# Patient Record
Sex: Female | Born: 1980 | Race: Black or African American | Hispanic: No | Marital: Single | Smoking: Never smoker
Health system: Southern US, Community
[De-identification: ages and names within clinical notes are randomized; demographics above are authoritative.]

## PROBLEM LIST (undated history)

## (undated) DIAGNOSIS — B009 Herpesviral infection, unspecified: Secondary | ICD-10-CM

## (undated) DIAGNOSIS — J45909 Unspecified asthma, uncomplicated: Secondary | ICD-10-CM

## (undated) DIAGNOSIS — G43909 Migraine, unspecified, not intractable, without status migrainosus: Secondary | ICD-10-CM

## (undated) HISTORY — PX: TUBAL LIGATION: SHX77

---

## 2006-08-15 ENCOUNTER — Emergency Department (HOSPITAL_COMMUNITY): Admission: EM | Admit: 2006-08-15 | Discharge: 2006-08-15 | Payer: Self-pay | Admitting: Emergency Medicine

## 2007-04-21 ENCOUNTER — Emergency Department (HOSPITAL_COMMUNITY): Admission: EM | Admit: 2007-04-21 | Discharge: 2007-04-22 | Payer: Self-pay | Admitting: Emergency Medicine

## 2007-05-15 ENCOUNTER — Emergency Department (HOSPITAL_COMMUNITY): Admission: EM | Admit: 2007-05-15 | Discharge: 2007-05-16 | Payer: Self-pay | Admitting: Emergency Medicine

## 2007-08-17 ENCOUNTER — Emergency Department (HOSPITAL_COMMUNITY): Admission: EM | Admit: 2007-08-17 | Discharge: 2007-08-17 | Payer: Self-pay | Admitting: Emergency Medicine

## 2007-12-13 ENCOUNTER — Other Ambulatory Visit: Admission: RE | Admit: 2007-12-13 | Discharge: 2007-12-13 | Payer: Self-pay | Admitting: Family Medicine

## 2008-09-10 ENCOUNTER — Emergency Department (HOSPITAL_COMMUNITY): Admission: EM | Admit: 2008-09-10 | Discharge: 2008-09-11 | Payer: Self-pay | Admitting: Emergency Medicine

## 2009-11-25 ENCOUNTER — Emergency Department (HOSPITAL_COMMUNITY): Admission: EM | Admit: 2009-11-25 | Discharge: 2009-11-25 | Payer: Self-pay | Admitting: Emergency Medicine

## 2011-03-03 ENCOUNTER — Other Ambulatory Visit (HOSPITAL_COMMUNITY)
Admission: RE | Admit: 2011-03-03 | Discharge: 2011-03-03 | Disposition: A | Discharge status: Home / Self Care | Payer: PRIVATE HEALTH INSURANCE | Source: Ambulatory Visit | Attending: Obstetrics and Gynecology | Admitting: Obstetrics and Gynecology

## 2011-03-03 DIAGNOSIS — Z01419 Encounter for gynecological examination (general) (routine) without abnormal findings: Secondary | ICD-10-CM | POA: Insufficient documentation

## 2011-03-03 DIAGNOSIS — Z113 Encounter for screening for infections with a predominantly sexual mode of transmission: Secondary | ICD-10-CM | POA: Insufficient documentation

## 2011-09-02 LAB — COMPREHENSIVE METABOLIC PANEL
Albumin: 4.1
BUN: 6
Calcium: 9.5
Creatinine, Ser: 0.63
Total Protein: 7.8

## 2011-09-02 LAB — URINALYSIS, ROUTINE W REFLEX MICROSCOPIC
Glucose, UA: NEGATIVE
Hgb urine dipstick: NEGATIVE
Ketones, ur: NEGATIVE
Protein, ur: NEGATIVE
pH: 6.5

## 2011-09-02 LAB — DIFFERENTIAL
Basophils Relative: 0
Eosinophils Absolute: 0
Lymphs Abs: 1
Neutrophils Relative %: 87 — ABNORMAL HIGH

## 2011-09-02 LAB — CBC
MCHC: 35.2
RBC: 5.03
RDW: 13.1

## 2011-09-02 LAB — PREGNANCY, URINE: Preg Test, Ur: NEGATIVE

## 2012-03-01 DIAGNOSIS — N390 Urinary tract infection, site not specified: Secondary | ICD-10-CM | POA: Insufficient documentation

## 2012-03-01 DIAGNOSIS — IMO0001 Reserved for inherently not codable concepts without codable children: Secondary | ICD-10-CM | POA: Insufficient documentation

## 2012-03-01 DIAGNOSIS — D269 Other benign neoplasm of uterus, unspecified: Secondary | ICD-10-CM | POA: Insufficient documentation

## 2012-03-01 DIAGNOSIS — J45909 Unspecified asthma, uncomplicated: Secondary | ICD-10-CM | POA: Insufficient documentation

## 2012-03-01 DIAGNOSIS — G43909 Migraine, unspecified, not intractable, without status migrainosus: Secondary | ICD-10-CM | POA: Insufficient documentation

## 2012-03-01 DIAGNOSIS — Z975 Presence of (intrauterine) contraceptive device: Secondary | ICD-10-CM | POA: Insufficient documentation

## 2012-12-07 ENCOUNTER — Emergency Department (HOSPITAL_COMMUNITY)
Admission: EM | Admit: 2012-12-07 | Discharge: 2012-12-07 | Disposition: A | Discharge status: Home / Self Care | Payer: 59 | Source: Home / Self Care | Attending: Emergency Medicine | Admitting: Emergency Medicine

## 2012-12-07 ENCOUNTER — Encounter (HOSPITAL_COMMUNITY): Payer: Self-pay | Admitting: Emergency Medicine

## 2012-12-07 DIAGNOSIS — N390 Urinary tract infection, site not specified: Secondary | ICD-10-CM

## 2012-12-07 HISTORY — DX: Unspecified asthma, uncomplicated: J45.909

## 2012-12-07 HISTORY — DX: Migraine, unspecified, not intractable, without status migrainosus: G43.909

## 2012-12-07 LAB — POCT URINALYSIS DIP (DEVICE)
Protein, ur: NEGATIVE mg/dL
Specific Gravity, Urine: 1.01 (ref 1.005–1.030)
Urobilinogen, UA: 0.2 mg/dL (ref 0.0–1.0)

## 2012-12-07 MED ORDER — CEPHALEXIN 500 MG PO CAPS: 500.0000 mg | ORAL_CAPSULE | Freq: Three times a day (TID) | ORAL | Status: DC

## 2012-12-07 MED ORDER — CEPHALEXIN 500 MG PO CAPS: 500.0000 mg | ORAL_CAPSULE | Freq: Three times a day (TID) | ORAL | Status: AC

## 2012-12-07 NOTE — ED Provider Notes (Signed)
History     CSN: [id number]  Arrival date & time 12/07/12  1754   First MD Initiated Contact with Patient 12/07/12 1805      Chief Complaint  Patient presents with  . Urinary Tract Infection    frequency and urgency. pt states feels like she is not empting bladder. dysuria. symptoms x last night and has gradually gotten worse.     (Consider location/radiation/quality/duration/timing/severity/associated sxs/prior treatment) HPI Comments: Patient presents urgent care this evening complaining of increased frequency and urgency with urination some discomfort mainly at the end of urination feels just like she needs to continue urinating but she can't. Her symptoms are about 2 days ago but they have been become worse since last night. Patient denies any flank pain, nausea vomiting or fevers. Has taken some over-the-counter azo, and have increase in fluids. Her symptoms have not improved. Denies any further symptoms such as vaginal discharge bleeding.  Patient is a 32 y.o. female presenting with urinary tract infection.  Urinary Tract Infection This is a new problem. The current episode started more than 2 days ago. The problem occurs constantly. The problem has been gradually worsening. Associated symptoms include abdominal pain. Pertinent negatives include no shortness of breath. Exacerbated by: Urinating. Nothing relieves the symptoms. She has tried nothing for the symptoms.    Past Medical History  Diagnosis Date  . Asthma   . Migraines     Past Surgical History  Procedure Date  . Cesarean section   . Tubal ligation     History reviewed. No pertinent family history.  History  Substance Use Topics  . Smoking status: Never Smoker   . Smokeless tobacco: Not on file  . Alcohol Use: No    OB History    Grav Para Term Preterm Abortions TAB SAB Ect Mult Living                  Review of Systems  Constitutional: Negative for fever, chills, diaphoresis, activity change,  appetite change and fatigue.  Respiratory: Negative for shortness of breath.   Gastrointestinal: Positive for abdominal pain.  Genitourinary: Positive for dysuria, urgency, frequency and difficulty urinating. Negative for flank pain, decreased urine volume, vaginal bleeding, vaginal discharge, vaginal pain, menstrual problem and pelvic pain.  Skin: Negative for color change, pallor and rash.  Neurological: Negative for dizziness.    Allergies  Review of patient's allergies indicates no known allergies.  Home Medications   Current Outpatient Rx  Name  Route  Sig  Dispense  Refill  . NORGESTREL-ETHINYL ESTRADIOL 0.3-30 MG-MCG PO TABS   Oral   Take 1 tablet by mouth daily.         Marland Kitchen VALACYCLOVIR HCL 1 G PO TABS   Oral   Take 1,000 mg by mouth 2 (two) times daily.         . CEPHALEXIN 500 MG PO CAPS   Oral   Take 1 capsule (500 mg total) by mouth 3 (three) times daily.   20 capsule   0     BP 106/59  Pulse 65  Temp 98.1 F (36.7 C) (Oral)  Resp 16  SpO2 100%  LMP 11/22/2012  Physical Exam  Nursing note and vitals reviewed. Constitutional: Vital signs are normal. She appears well-developed and well-nourished.  Non-toxic appearance. She does not have a sickly appearance. She does not appear ill. No distress.  HENT:  Head: Normocephalic.  Mouth/Throat: No oropharyngeal exudate.  Eyes: Conjunctivae normal are normal.  Neck: No JVD  present.  Cardiovascular: Normal rate.  Exam reveals no gallop and no friction rub.   No murmur heard. Pulmonary/Chest: Effort normal and breath sounds normal.  Abdominal: Soft. She exhibits no distension and no mass. There is tenderness in the suprapubic area. There is no rebound, no guarding and no CVA tenderness.    Lymphadenopathy:    She has no cervical adenopathy.  Neurological: She is alert.  Skin: No rash noted. No erythema.    ED Course  Procedures (including critical care time)  Labs Reviewed  POCT URINALYSIS DIP (DEVICE)  - Abnormal; Notable for the following:    Glucose, UA 100 (*)     Hgb urine dipstick TRACE (*)     Nitrite POSITIVE (*)     Leukocytes, UA SMALL (*)  Biochemical Testing Only. Please order routine urinalysis from main lab if confirmatory testing is needed.   All other components within normal limits  POCT PREGNANCY, URINE   No results found.   1. Urinary tract infection       MDM  Uncomplicated urinary tract infection. Patient has been prescribed Keflex. Instructed about symptoms that should warrant her return for further evaluation. Patient agree with treatment plan and followup care as necessary.        Jimmie Molly, MD 12/07/12 [phone]

## 2012-12-07 NOTE — ED Notes (Signed)
Pt c/o uti. Urgency and frequency with urinating. Dysuria. "feels like not empting bladder. Symptoms started last night and gradually getting worse. Pt denies lower back pain. Pt has taken azo and pushed fluids with no relief in symptoms.

## 2013-01-09 ENCOUNTER — Encounter (HOSPITAL_COMMUNITY): Payer: Self-pay | Admitting: Emergency Medicine

## 2013-01-09 ENCOUNTER — Emergency Department (HOSPITAL_COMMUNITY)
Admission: EM | Admit: 2013-01-09 | Discharge: 2013-01-09 | Disposition: A | Discharge status: Home / Self Care | Payer: 59 | Attending: Emergency Medicine | Admitting: Emergency Medicine

## 2013-01-09 DIAGNOSIS — M436 Torticollis: Secondary | ICD-10-CM | POA: Insufficient documentation

## 2013-01-09 DIAGNOSIS — J45909 Unspecified asthma, uncomplicated: Secondary | ICD-10-CM | POA: Insufficient documentation

## 2013-01-09 DIAGNOSIS — Z8679 Personal history of other diseases of the circulatory system: Secondary | ICD-10-CM | POA: Insufficient documentation

## 2013-01-09 DIAGNOSIS — Z79899 Other long term (current) drug therapy: Secondary | ICD-10-CM | POA: Insufficient documentation

## 2013-01-09 MED ORDER — DIAZEPAM 5 MG PO TABS: 5.0000 mg | ORAL_TABLET | Freq: Three times a day (TID) | ORAL | Status: DC | PRN

## 2013-01-09 MED ORDER — DIAZEPAM 5 MG PO TABS
5.0000 mg | ORAL_TABLET | Freq: Once | ORAL | Status: AC
  Administered 2013-01-09: 5 mg via ORAL
  Filled 2013-01-09: qty 1

## 2013-01-09 NOTE — ED Notes (Addendum)
Patient complaining of right sided neck pain upon waking up this morning.  Patient reports that she could have slept on her neck wrong.  Patient reports that pain radiates down into her shoulder blade.  Denies chest pain, shortness of breath, nausea, vomiting, recent cold symptoms, and sore throat.

## 2013-01-09 NOTE — ED Notes (Signed)
MD at bedside. 

## 2013-01-09 NOTE — ED Provider Notes (Signed)
History    This chart was scribed for non-physician practitioner working with Mallory Acevedo, * by Toya Smothers, ED Scribe. This patient was seen in room TR09C/TR09C and the patient's care was started at 21:46.   CSN: [id number]  Arrival date & time 01/09/13  1924   First MD Initiated Contact with Patient 01/09/13 2139      Chief Complaint  Patient presents with  . Neck Pain   Patient is a 32 y.o. female presenting with neck pain. The history is provided by the patient. No language interpreter was used.  Neck Pain Pain location:  Generalized neck Quality:  Aching Pain radiates to:  Does not radiate Pain severity:  Severe Pain is:  Same all the time Onset quality:  Sudden Duration:  14 hours Timing:  Constant Progression:  Unchanged Chronicity:  New Context: not fall, not jumping from heights, not MVA and not recent injury   Relieved by:  Nothing Worsened by:  Twisting and position Ineffective treatments:  Muscle relaxants   LAKSHMI SUNDEEN is a 32 y.o. female who presents to the Emergency Department complaining of 14 hours of new, sudden onset, constant, unchanged, neck pain upon waking. No injury mechanism or change in activity. Pain is localized, described as a soreness, worse with movement, and alleviated by nothing. Typically healthy at baseline, CC represents a moderate deviation from baseline health. There has been no improvement despite taking muscle relaxers. No HA, weakness, dizziness, stiffness, trismus    -Recent illness  Past Medical History  Diagnosis Date  . Asthma   . Migraines     Past Surgical History  Procedure Laterality Date  . Cesarean section    . Tubal ligation      History reviewed. No pertinent family history.  History  Substance Use Topics  . Smoking status: Never Smoker   . Smokeless tobacco: Not on file  . Alcohol Use: Yes     Comment: Occassional Use   Review of Systems  HENT: Positive for neck pain.   All other systems  reviewed and are negative.    Allergies  Review of patient's allergies indicates no known allergies.  Home Medications   Current Outpatient Rx  Name  Route  Sig  Dispense  Refill  . ibuprofen (ADVIL,MOTRIN) 200 MG tablet   Oral   Take 200 mg by mouth every 6 (six) hours as needed for pain.         . norgestrel-ethinyl estradiol (LO/OVRAL,CRYSELLE) 0.3-30 MG-MCG tablet   Oral   Take 1 tablet by mouth daily.         . valACYclovir (VALTREX) 1000 MG tablet   Oral   Take 1,000 mg by mouth daily.            BP 111/62  Pulse 67  Temp(Src) 98.4 F (36.9 C) (Oral)  Resp 18  SpO2 99%  LMP 12/26/2012  Physical Exam  Nursing note and vitals reviewed. Constitutional: She is oriented to person, place, and time. She appears well-developed and well-nourished. No distress.  HENT:  Head: Normocephalic and atraumatic.  Eyes: EOM are normal.  Neck: Neck supple. No tracheal deviation present.  No trismus. No meningeal signs. Generalized tenderness throughout.  Cardiovascular: Normal rate.   Pulmonary/Chest: Effort normal. No respiratory distress.  Musculoskeletal: Normal range of motion.  Lymphadenopathy:    She has no cervical adenopathy.  Neurological: She is alert and oriented to person, place, and time.  Skin: Skin is warm and dry.  Psychiatric: She has  a normal mood and affect. Her behavior is normal.    ED Course  Procedures DIAGNOSTIC STUDIES: Oxygen Saturation is 99% on room air, normal by my interpretation.    COORDINATION OF CARE: 21:45- Evaluated Pt. Pt is awake, alert, and without distress. 21:50- Patient understand and agree with initial ED impression and plan with expectations set for ED visit.     Labs Reviewed - No data to display No results found.   No diagnosis found.  Torticollis.  Improvement after valium.  Discussed with Dr. Blinda Leatherwood.  Will provide prescription for valium for home use--follow-up with PCP.  MDM     I personally performed  the services described in this documentation, which was scribed in my presence. The recorded information has been reviewed and is accurate.       Jimmye Norman, NP 01/10/13 Jacinta Shoe

## 2013-01-10 NOTE — ED Provider Notes (Signed)
Medical screening examination/treatment/procedure(s) were performed by non-physician practitioner and as supervising physician I was immediately available for consultation/collaboration.  Gilda Crease, MD 01/10/13 1215

## 2013-02-04 ENCOUNTER — Emergency Department (HOSPITAL_COMMUNITY)
Admission: EM | Admit: 2013-02-04 | Discharge: 2013-02-04 | Disposition: A | Discharge status: Home / Self Care | Payer: 59 | Source: Home / Self Care | Attending: Emergency Medicine | Admitting: Emergency Medicine

## 2013-02-04 ENCOUNTER — Encounter (HOSPITAL_COMMUNITY): Payer: Self-pay | Admitting: Emergency Medicine

## 2013-02-04 DIAGNOSIS — N3000 Acute cystitis without hematuria: Secondary | ICD-10-CM

## 2013-02-04 LAB — POCT PREGNANCY, URINE: Preg Test, Ur: NEGATIVE

## 2013-02-04 LAB — POCT URINALYSIS DIP (DEVICE)
Bilirubin Urine: NEGATIVE
Ketones, ur: NEGATIVE mg/dL
Leukocytes, UA: NEGATIVE
Specific Gravity, Urine: 1.015 (ref 1.005–1.030)
pH: 6.5 (ref 5.0–8.0)

## 2013-02-04 MED ORDER — PHENAZOPYRIDINE HCL 200 MG PO TABS: 200.0000 mg | ORAL_TABLET | Freq: Three times a day (TID) | ORAL | Status: DC | PRN

## 2013-02-04 MED ORDER — CEPHALEXIN 500 MG PO CAPS: 500.0000 mg | ORAL_CAPSULE | Freq: Three times a day (TID) | ORAL | Status: DC

## 2013-02-04 NOTE — ED Provider Notes (Signed)
Chief Complaint:   Chief Complaint  Patient presents with  . Urinary Tract Infection    History of Present Illness:   Mallory Acevedo is a 32 year old female who has had a five-day history of dysuria, frequency, urgency, blood in the urine, and lower back pain. She's had frequent urinary tract infections since she was 10. She's undergone several CT scans and see a neurologist, but no cause was found. She denies any fever, chills, nausea, vomiting, or GI symptoms. No GYN complaints.  Review of Systems:  Other than noted above, the patient denies any of the following symptoms: General:  No fevers, chills, sweats, aches, or fatigue. GI:  No abdominal pain, back pain, nausea, vomiting, diarrhea, or constipation. GU:  No dysuria, frequency, urgency, hematuria, or incontinence. GYN:  No discharge, itching, vulvar pain or lesions, pelvic pain, or abnormal vaginal bleeding.  PMFSH:  Past medical history, family history, social history, meds, and allergies were reviewed.  Physical Exam:   Vital signs:  BP 116/74  Pulse 79  Temp(Src) 98.8 F (37.1 C) (Oral)  Resp 18  SpO2 98%  LMP 12/26/2012 Gen:  Alert, oriented, in no distress. Lungs:  Clear to auscultation, no wheezes, rales or rhonchi. Heart:  Regular rhythm, no gallop or murmer. Abdomen:  Flat and soft. There was slight suprapubic pain to palpation.  No guarding, or rebound.  No hepato-splenomegaly or mass.  Bowel sounds were normally active.  No hernia. Back:  No CVA tenderness.  Skin:  Clear, warm and dry.  Labs:   Results for orders placed during the hospital encounter of 02/04/13  POCT URINALYSIS DIP (DEVICE)      Result Value Range   Glucose, UA NEGATIVE  NEGATIVE mg/dL   Bilirubin Urine NEGATIVE  NEGATIVE   Ketones, ur NEGATIVE  NEGATIVE mg/dL   Specific Gravity, Urine 1.015  1.005 - 1.030   Hgb urine dipstick LARGE (*) NEGATIVE   pH 6.5  5.0 - 8.0   Protein, ur NEGATIVE  NEGATIVE mg/dL   Urobilinogen, UA 0.2  0.0 - 1.0  mg/dL   Nitrite NEGATIVE  NEGATIVE   Leukocytes, UA NEGATIVE  NEGATIVE  POCT PREGNANCY, URINE      Result Value Range   Preg Test, Ur NEGATIVE  NEGATIVE     Other Labs Obtained at Urgent Care Center:  A urine culture was obtained.  Results are pending at this time and we will call about any positive results.  Assessment: The encounter diagnosis was Acute cystitis.   Plan:   1.  The following meds were prescribed:   Discharge Medication List as of 02/04/2013  1:43 PM    START taking these medications   Details  cephALEXin (KEFLEX) 500 MG capsule Take 1 capsule (500 mg total) by mouth 3 (three) times daily., Starting 02/04/2013, Until Discontinued, Normal    phenazopyridine (PYRIDIUM) 200 MG tablet Take 1 tablet (200 mg total) by mouth 3 (three) times daily as needed for pain., Starting 02/04/2013, Until Discontinued, Normal       2.  The patient was instructed in symptomatic care and handouts were given. 3.  The patient was told to return if becoming worse in any way, if no better in 3 or 4 days, and given some red flag symptoms that would indicate earlier return. 4.  The patient was told to avoid intercourse for 10 days, get extra fluids, and return for a follow up with her primary care doctor at the completion of treatment for a repeat UA  and culture.     Reuben Likes, MD 02/04/13 2011

## 2013-02-04 NOTE — ED Notes (Addendum)
C/o another UTI, hist of multiple UTI since child; DENIES ANY CHANCE OF STD

## 2013-02-06 LAB — URINE CULTURE

## 2013-02-06 NOTE — ED Notes (Signed)
Urine culture: 45,000 colonies E. Coli.  Pt. adequately treated with Keflex.  Vassie Moselle 02/06/2013

## 2013-02-09 ENCOUNTER — Telehealth (HOSPITAL_COMMUNITY): Payer: Self-pay | Admitting: *Deleted

## 2013-02-09 NOTE — ED Notes (Signed)
Pt. called on VM for her lab results. Wants them faxed to her at work. I called pt. back and left a message for her to call me. I told her I could not fax them to her but I would be glad to give them to her by phone. I told her I would be back on Tues between 2p-4p if she does not get me tonight. Vassie Moselle 02/09/2013

## 2013-04-30 DIAGNOSIS — E559 Vitamin D deficiency, unspecified: Secondary | ICD-10-CM | POA: Insufficient documentation

## 2013-06-06 ENCOUNTER — Emergency Department (HOSPITAL_COMMUNITY)
Admission: EM | Admit: 2013-06-06 | Discharge: 2013-06-06 | Disposition: A | Discharge status: Home / Self Care | Payer: 59 | Source: Home / Self Care

## 2013-06-06 ENCOUNTER — Encounter (HOSPITAL_COMMUNITY): Payer: Self-pay | Admitting: *Deleted

## 2013-06-06 DIAGNOSIS — N390 Urinary tract infection, site not specified: Secondary | ICD-10-CM

## 2013-06-06 HISTORY — DX: Herpesviral infection, unspecified: B00.9

## 2013-06-06 LAB — POCT URINALYSIS DIP (DEVICE)
Bilirubin Urine: NEGATIVE
Glucose, UA: NEGATIVE mg/dL
Hgb urine dipstick: NEGATIVE
Ketones, ur: NEGATIVE mg/dL
Nitrite: NEGATIVE
Protein, ur: NEGATIVE mg/dL
Specific Gravity, Urine: 1.02 (ref 1.005–1.030)
pH: 7 (ref 5.0–8.0)

## 2013-06-06 MED ORDER — CEPHALEXIN 500 MG PO CAPS: 500.0000 mg | ORAL_CAPSULE | Freq: Three times a day (TID) | ORAL | Status: DC

## 2013-06-06 NOTE — ED Notes (Signed)
C/o  frequent urination, foul odor of urine, pelvic pain when she first urinates in the morning ( started Monday).  Other symptoms off and on for 1 month. Tried water, cranberry juice and Azo.

## 2013-06-06 NOTE — ED Provider Notes (Signed)
History    CSN: [id number] Arrival date & time 06/06/13  1708  None    Chief Complaint  Patient presents with  . Urinary Tract Infection   (Consider location/radiation/quality/duration/timing/severity/associated sxs/prior Treatment) HPI Comments: 32-year-old female complaining of a malodorous urine, pelvic discomfort in the morning with discomfort upon urination and urinary frequency. Denies actual dysuria. Denies fever, back pain, nausea or vomiting.  Past Medical History  Diagnosis Date  . Asthma   . Migraines   . HSV-2 (herpes simplex virus 2) infection    Past Surgical History  Procedure Laterality Date  . Cesarean section    . Tubal ligation    . Cesarean section  '99, '00 ,'02    x 3   History reviewed. No pertinent family history. History  Substance Use Topics  . Smoking status: Never Smoker   . Smokeless tobacco: Not on file  . Alcohol Use: No   OB History   Grav Para Term Preterm Abortions TAB SAB Ect Mult Living                 Review of Systems  Constitutional: Negative for fever, activity change and fatigue.  HENT: Negative.   Respiratory: Negative.   Gastrointestinal: Negative.   Genitourinary: Positive for frequency and pelvic pain. Negative for dysuria, flank pain, vaginal discharge and vaginal pain.  Musculoskeletal: Negative.   Neurological: Negative.     Allergies  Review of patient's allergies indicates no known allergies.  Home Medications   Current Outpatient Rx  Name  Route  Sig  Dispense  Refill  . valACYclovir (VALTREX) 1000 MG tablet   Oral   Take 1,000 mg by mouth daily.          . cephALEXin (KEFLEX) 500 MG capsule   Oral   Take 1 capsule (500 mg total) by mouth 3 (three) times daily.   30 capsule   0   . cephALEXin (KEFLEX) 500 MG capsule   Oral   Take 1 capsule (500 mg total) by mouth 3 (three) times daily.   21 capsule   0   . diazepam (VALIUM) 5 MG tablet   Oral   Take 1 tablet (5 mg total) by mouth every 8  (eight) hours as needed (neck spasm).   10 tablet   0   . ibuprofen (ADVIL,MOTRIN) 200 MG tablet   Oral   Take 200 mg by mouth every 6 (six) hours as needed for pain.         . norgestrel-ethinyl estradiol (LO/OVRAL,CRYSELLE) 0.3-30 MG-MCG tablet   Oral   Take 1 tablet by mouth daily.         . phenazopyridine (PYRIDIUM) 200 MG tablet   Oral   Take 1 tablet (200 mg total) by mouth 3 (three) times daily as needed for pain.   15 tablet   0    BP 109/61  Pulse 61  Temp(Src) 98.5 F (36.9 C) (Oral)  Resp 16  SpO2 100%  LMP 03/25/2013 Physical Exam  Nursing note and vitals reviewed. Constitutional: She is oriented to person, place, and time. She appears well-developed and well-nourished. No distress.  Neck: Normal range of motion. Neck supple.  Cardiovascular: Normal rate, regular rhythm and normal heart sounds.   Pulmonary/Chest: Effort normal and breath sounds normal. No respiratory distress. She has no wheezes.  Abdominal: Soft. She exhibits no distension. There is no tenderness.  Genitourinary:  Note tenderness of the external pelvis or abdomen.  Neurological: She is alert and  oriented to person, place, and time. She exhibits normal muscle tone.  Skin: Skin is warm and dry.  Psychiatric: She has a normal mood and affect.    ED Course  Procedures (including critical care time) Labs Reviewed  POCT URINALYSIS DIP (DEVICE) - Abnormal; Notable for the following:    Leukocytes, UA TRACE (*)    All other components within normal limits  URINE CULTURE  POCT PREGNANCY, URINE   No results found. 1. UTI (lower urinary tract infection)     MDM  Keflex 500 mg 3 times a day for 7 days Drink plenty of fluids stay well hydrated Urine culture obtained For any new symptoms or worsening may return or followup with your PCP.  Hayden Rasmussen, NP 06/06/13 1806

## 2013-06-07 NOTE — ED Provider Notes (Signed)
Medical screening examination/treatment/procedure(s) were performed by a resident physician or non-physician practitioner and as the supervising physician I was immediately available for consultation/collaboration.  Jehieli Brassell, MD   Myrissa Chipley S Welma Mccombs, MD 06/07/13 0746 

## 2013-06-09 LAB — URINE CULTURE: Colony Count: >=100000

## 2013-06-10 NOTE — ED Notes (Signed)
Urine culture: >100,000 colonies E. Coli. Pt. adequately treated with Keflex. Pearce Littlefield M 06/10/2013  

## 2013-08-12 ENCOUNTER — Emergency Department (HOSPITAL_COMMUNITY): Payer: 59

## 2013-08-12 ENCOUNTER — Emergency Department (HOSPITAL_COMMUNITY)
Admission: EM | Admit: 2013-08-12 | Discharge: 2013-08-12 | Disposition: A | Discharge status: Home / Self Care | Payer: 59 | Attending: Emergency Medicine | Admitting: Emergency Medicine

## 2013-08-12 ENCOUNTER — Encounter (HOSPITAL_COMMUNITY): Payer: Self-pay | Admitting: *Deleted

## 2013-08-12 DIAGNOSIS — Z8619 Personal history of other infectious and parasitic diseases: Secondary | ICD-10-CM | POA: Insufficient documentation

## 2013-08-12 DIAGNOSIS — Z79899 Other long term (current) drug therapy: Secondary | ICD-10-CM | POA: Insufficient documentation

## 2013-08-12 DIAGNOSIS — J45901 Unspecified asthma with (acute) exacerbation: Secondary | ICD-10-CM | POA: Insufficient documentation

## 2013-08-12 DIAGNOSIS — Z8679 Personal history of other diseases of the circulatory system: Secondary | ICD-10-CM | POA: Insufficient documentation

## 2013-08-12 MED ORDER — PREDNISONE 20 MG PO TABS
60.0000 mg | ORAL_TABLET | Freq: Once | ORAL | Status: AC
  Administered 2013-08-12: 60 mg via ORAL
  Filled 2013-08-12: qty 3

## 2013-08-12 MED ORDER — PREDNISONE 50 MG PO TABS: ORAL_TABLET | ORAL | Status: DC

## 2013-08-12 MED ORDER — ALBUTEROL SULFATE HFA 108 (90 BASE) MCG/ACT IN AERS: 1.0000 | INHALATION_SPRAY | Freq: Four times a day (QID) | RESPIRATORY_TRACT | Status: DC | PRN

## 2013-08-12 MED ORDER — ALBUTEROL SULFATE (5 MG/ML) 0.5% IN NEBU
2.5000 mg | INHALATION_SOLUTION | Freq: Once | RESPIRATORY_TRACT | Status: AC
  Administered 2013-08-12: 2.5 mg via RESPIRATORY_TRACT
  Filled 2013-08-12: qty 0.5

## 2013-08-12 NOTE — ED Provider Notes (Signed)
CSN: [id number]     Arrival date & time 08/12/13  0353 History   First MD Initiated Contact with Patient 08/12/13 [phone]     Chief Complaint  Patient presents with  . Asthma    Patient is a 32 y.o. female presenting with wheezing. The history is provided by the patient.  Wheezing Severity:  Moderate Onset quality:  Sudden Timing:  Constant Progression:  Improving Chronicity:  Recurrent Worsened by:  Nothing tried Associated symptoms: cough and shortness of breath   Associated symptoms: no chest pain   pt reports she went to bed feeling well She reports that she woke up with abdominal pain that triggered cough and SOB She reports she has h/o asthma but it is well controlled without any recent exacerbations No cp She reports abd pain has resolved She reports this episode is similar to prior asthma exacerbations  Past Medical History  Diagnosis Date  . Asthma   . Migraines   . HSV-2 (herpes simplex virus 2) infection    Past Surgical History  Procedure Laterality Date  . Cesarean section    . Tubal ligation    . Cesarean section  '99, '00 ,'02    x 3   No family history on file. History  Substance Use Topics  . Smoking status: Never Smoker   . Smokeless tobacco: Not on file  . Alcohol Use: No   OB History   Grav Para Term Preterm Abortions TAB SAB Ect Mult Living                 Review of Systems  Respiratory: Positive for cough, shortness of breath and wheezing.   Cardiovascular: Negative for chest pain.  Gastrointestinal: Positive for abdominal pain.  All other systems reviewed and are negative.    Allergies  Review of patient's allergies indicates no known allergies.  Home Medications   Current Outpatient Rx  Name  Route  Sig  Dispense  Refill  . diazepam (VALIUM) 5 MG tablet   Oral   Take 1 tablet (5 mg total) by mouth every 8 (eight) hours as needed (neck spasm).   10 tablet   0   . ibuprofen (ADVIL,MOTRIN) 200 MG tablet   Oral   Take 200 mg by  mouth every 6 (six) hours as needed for pain.         . valACYclovir (VALTREX) 1000 MG tablet   Oral   Take 1,000 mg by mouth daily.           BP 138/80  Pulse 82  Temp(Src) 98.5 F (36.9 C) (Oral)  Resp 20  SpO2 95% Physical Exam CONSTITUTIONAL: Well developed/well nourished HEAD: Normocephalic/atraumatic EYES: EOMI/PERRL ENMT: Mucous membranes moist NECK: supple no meningeal signs SPINE:entire spine nontender CV: S1/S2 noted, no murmurs/rubs/gallops noted LUNGS: Lungs are clear to auscultation bilaterally, no apparent distress ABDOMEN: soft, nontender, no rebound or guarding GU:no cva tenderness NEURO: Pt is awake/alert, moves all extremitiesx4 EXTREMITIES: pulses normal, full ROM, no LE edema noted SKIN: warm, color normal PSYCH: no abnormalities of mood noted  ED Course  Procedures  Labs Review Labs Reviewed - No data to display Imaging Review Dg Chest Portable 1 View  08/12/2013   CLINICAL DATA:  Asthma attack  EXAM: PORTABLE CHEST - 1 VIEW  COMPARISON:  11/25/2009  FINDINGS: The heart size and mediastinal contours are within normal limits. Both lungs are clear. The visualized skeletal structures are unremarkable.  IMPRESSION: No active disease.   Electronically Signed  By: Oley Balm M.D.   On: 08/12/2013 04:50   Pt presented with wheezing and nurse initiated nebs By the time of my evaluation, her wheezing had cleared and she felt well and requested d/c home She denies h/o severe asthma and denies h/o ICU admissions  MDM  No diagnosis found. Nursing notes including past medical history and social history reviewed and considered in documentation xrays reviewed and considered    Date: 08/12/2013  Rate: 82  Rhythm: normal sinus rhythm  QRS Axis: normal  Intervals: normal  ST/T Wave abnormalities: nonspecific ST changes  Conduction Disutrbances:none     Joya Gaskins, MD 08/12/13 [phone]

## 2013-08-12 NOTE — ED Notes (Signed)
Pt c/o asthma exacerbation. Audible wheeze heard without stethoscope. Productive cough present (clear sputum). Pt woke up due abdominal pain, but abdominal pain has since resolved.

## 2014-09-23 ENCOUNTER — Other Ambulatory Visit: Payer: Self-pay | Admitting: Obstetrics & Gynecology

## 2014-09-23 DIAGNOSIS — Z1231 Encounter for screening mammogram for malignant neoplasm of breast: Secondary | ICD-10-CM

## 2015-03-18 ENCOUNTER — Other Ambulatory Visit: Payer: Self-pay | Admitting: Obstetrics & Gynecology

## 2015-03-18 DIAGNOSIS — N63 Unspecified lump in unspecified breast: Secondary | ICD-10-CM

## 2015-03-20 ENCOUNTER — Other Ambulatory Visit: Payer: Self-pay | Admitting: Obstetrics & Gynecology

## 2015-03-20 DIAGNOSIS — N63 Unspecified lump in unspecified breast: Secondary | ICD-10-CM

## 2015-03-24 ENCOUNTER — Ambulatory Visit
Admission: RE | Admit: 2015-03-24 | Discharge: 2015-03-24 | Disposition: A | Discharge status: Home / Self Care | Payer: 59 | Source: Ambulatory Visit | Attending: Obstetrics & Gynecology | Admitting: Obstetrics & Gynecology

## 2015-03-24 ENCOUNTER — Ambulatory Visit
Admission: RE | Admit: 2015-03-24 | Discharge: 2015-03-24 | Disposition: A | Discharge status: Home / Self Care | Payer: PRIVATE HEALTH INSURANCE | Source: Ambulatory Visit | Attending: Obstetrics & Gynecology | Admitting: Obstetrics & Gynecology

## 2015-03-24 DIAGNOSIS — N63 Unspecified lump in unspecified breast: Secondary | ICD-10-CM

## 2015-03-25 ENCOUNTER — Other Ambulatory Visit: Payer: 59

## 2015-09-26 ENCOUNTER — Other Ambulatory Visit: Payer: Self-pay | Admitting: Obstetrics & Gynecology

## 2015-09-26 DIAGNOSIS — Z30431 Encounter for routine checking of intrauterine contraceptive device: Secondary | ICD-10-CM

## 2015-09-29 ENCOUNTER — Ambulatory Visit
Admission: RE | Admit: 2015-09-29 | Discharge: 2015-09-29 | Disposition: A | Discharge status: Home / Self Care | Payer: PRIVATE HEALTH INSURANCE | Source: Ambulatory Visit | Attending: Obstetrics & Gynecology | Admitting: Obstetrics & Gynecology

## 2015-09-29 DIAGNOSIS — Z30431 Encounter for routine checking of intrauterine contraceptive device: Secondary | ICD-10-CM

## 2015-10-06 ENCOUNTER — Other Ambulatory Visit: Payer: Self-pay | Admitting: Physician Assistant

## 2015-10-06 DIAGNOSIS — N83209 Unspecified ovarian cyst, unspecified side: Secondary | ICD-10-CM

## 2015-12-05 ENCOUNTER — Ambulatory Visit
Admission: RE | Admit: 2015-12-05 | Discharge: 2015-12-05 | Disposition: A | Discharge status: Home / Self Care | Payer: PRIVATE HEALTH INSURANCE | Source: Ambulatory Visit | Attending: Physician Assistant | Admitting: Physician Assistant

## 2015-12-05 DIAGNOSIS — N83209 Unspecified ovarian cyst, unspecified side: Secondary | ICD-10-CM

## 2016-10-24 ENCOUNTER — Ambulatory Visit (HOSPITAL_COMMUNITY)
Admission: EM | Admit: 2016-10-24 | Discharge: 2016-10-24 | Disposition: A | Discharge status: Home / Self Care | Payer: PRIVATE HEALTH INSURANCE | Attending: Emergency Medicine | Admitting: Emergency Medicine

## 2016-10-24 ENCOUNTER — Encounter (HOSPITAL_COMMUNITY): Payer: Self-pay

## 2016-10-24 DIAGNOSIS — R05 Cough: Secondary | ICD-10-CM | POA: Diagnosis not present

## 2016-10-24 DIAGNOSIS — J40 Bronchitis, not specified as acute or chronic: Secondary | ICD-10-CM

## 2016-10-24 DIAGNOSIS — R059 Cough, unspecified: Secondary | ICD-10-CM

## 2016-10-24 MED ORDER — ALBUTEROL SULFATE HFA 108 (90 BASE) MCG/ACT IN AERS: 2.0000 | INHALATION_SPRAY | RESPIRATORY_TRACT | 0 refills | Status: DC | PRN

## 2016-10-24 MED ORDER — HYDROCOD POLST-CPM POLST ER 10-8 MG/5ML PO SUER: 5.0000 mL | Freq: Two times a day (BID) | ORAL | 0 refills | Status: DC | PRN

## 2016-10-24 MED ORDER — PREDNISONE 50 MG PO TABS: ORAL_TABLET | ORAL | 0 refills | Status: DC

## 2016-10-24 MED ORDER — AZITHROMYCIN 250 MG PO TABS: ORAL_TABLET | ORAL | 0 refills | Status: DC

## 2016-10-24 NOTE — Discharge Instructions (Signed)
You have bronchitis/early pneumonia. Take azithromycin and prednisone as prescribed. Use tussionex as needed for cough. Use the albuterol every 4 hours as needed for wheezing or cough. You should see improvement in the next 3-5 days. If you develop fevers, difficulty breathing, or are just not getting better, please come back or go to the emergency room.

## 2016-10-24 NOTE — ED Triage Notes (Signed)
Pt said cough, congestion, SOB for the past 8 days. Has been taking dayquil, cough drops, Oj. No fever.

## 2016-10-24 NOTE — ED Provider Notes (Signed)
[facility]    CSN: [id number] Arrival date & time: 10/24/16  1208     History   Chief Complaint Chief Complaint  Patient presents with  . Cough    HPI Mallory Acevedo is a 35 y.o. female.   HPI  She is a 35 year old woman here for evaluation of cough. Symptoms started 8 days ago with a little bit of throat irritation. She went on to develop a lot of nasal congestion and rhinorrhea. She currently has somewhat improved nasal symptoms, but has had persistent cough. The cough will make her short of breath. She also reports feeling dyspneic with activities. No fevers. She has been doing DayQuil and NyQuil without improvement.  Past Medical History:  Diagnosis Date  . Asthma   . HSV-2 (herpes simplex virus 2) infection   . Migraines     There are no active problems to display for this patient.   Past Surgical History:  Procedure Laterality Date  . CESAREAN SECTION    . CESAREAN SECTION  '99, '00 ,'02   x 3  . TUBAL LIGATION      OB History    No data available       Home Medications    Prior to Admission medications   Medication Sig Start Date End Date Taking? Authorizing Provider  ibuprofen (ADVIL,MOTRIN) 200 MG tablet Take 200 mg by mouth every 6 (six) hours as needed for pain.   Yes Historical Provider, MD  valACYclovir (VALTREX) 1000 MG tablet Take 1,000 mg by mouth daily.    Yes Historical Provider, MD  albuterol (PROVENTIL HFA;VENTOLIN HFA) 108 (90 Base) MCG/ACT inhaler Inhale 2 puffs into the lungs every 4 (four) hours as needed for wheezing or shortness of breath. 10/24/16   Melony Overly, MD  azithromycin (ZITHROMAX Z-PAK) 250 MG tablet Take 2 pills today, then 1 pill daily until gone. 10/24/16   Melony Overly, MD  chlorpheniramine-HYDROcodone (TUSSIONEX PENNKINETIC ER) 10-8 MG/5ML SUER Take 5 mLs by mouth every 12 (twelve) hours as needed for cough. 10/24/16   Melony Overly, MD  diazepam (VALIUM) 5 MG tablet Take 1 tablet (5 mg total) by mouth  every 8 (eight) hours as needed (neck spasm). 01/09/13   Etta Quill, NP  predniSONE (DELTASONE) 50 MG tablet Take 1 pill daily for 5 days. 10/24/16   Melony Overly, MD    Family History No family history on file.  Social History Social History  Substance Use Topics  . Smoking status: Never Smoker  . Smokeless tobacco: Never Used  . Alcohol use No     Allergies   Patient has no known allergies.   Review of Systems Review of Systems As in history of present illness  Physical Exam Triage Vital Signs ED Triage Vitals  Enc Vitals Group     BP 10/24/16 1248 111/65     Pulse Rate 10/24/16 1248 69     Resp 10/24/16 1248 16     Temp 10/24/16 1248 98.6 F (37 C)     Temp Source 10/24/16 1248 Oral     SpO2 10/24/16 1248 100 %     Weight --      Height --      Head Circumference --      Peak Flow --      Pain Score 10/24/16 1251 0     Pain Loc --      Pain Edu? --      Excl. in [place]? --  No data found.   Updated Vital Signs BP 111/65 (BP Location: Left Arm)   Pulse 69   Temp 98.6 F (37 C) (Oral)   Resp 16   SpO2 100%   Visual Acuity Right Eye Distance:   Left Eye Distance:   Bilateral Distance:    Right Eye Near:   Left Eye Near:    Bilateral Near:     Physical Exam  Constitutional: She is oriented to person, place, and time. She appears well-developed and well-nourished. No distress.  HENT:  Mouth/Throat: Oropharynx is clear and moist. No oropharyngeal exudate.  Nasal mucosa is erythematous and boggy. Bilateral TMs are without erythema, but do have clear effusions.  Neck: Neck supple.  Cardiovascular: Normal rate, regular rhythm and normal heart sounds.   No murmur heard. Pulmonary/Chest: Effort normal and breath sounds normal. No respiratory distress. She has no wheezes. She has no rales.  Coughing frequently during exam  Lymphadenopathy:    She has no cervical adenopathy.  Neurological: She is alert and oriented to person, place, and time.     UC  Treatments / Results  Labs (all labs ordered are listed, but only abnormal results are displayed) Labs Reviewed - No data to display  EKG  EKG Interpretation None       Radiology No results found.  Procedures Procedures (including critical care time)  Medications Ordered in UC Medications - No data to display   Initial Impression / Assessment and Plan / UC Course  I have reviewed the triage vital signs and the nursing notes.  Pertinent labs & imaging results that were available during my care of the patient were reviewed by me and considered in my medical decision making (see chart for details).  Clinical Course     Treatment for bronchitis with azithromycin and prednisone. Albuterol and Tussionex for symptom control. Follow-up as needed.  Final Clinical Impressions(s) / UC Diagnoses   Final diagnoses:  Cough  Bronchitis    New Prescriptions Discharge Medication List as of 10/24/2016  1:00 PM    START taking these medications   Details  azithromycin (ZITHROMAX Z-PAK) 250 MG tablet Take 2 pills today, then 1 pill daily until gone., Normal    chlorpheniramine-HYDROcodone (TUSSIONEX PENNKINETIC ER) 10-8 MG/5ML SUER Take 5 mLs by mouth every 12 (twelve) hours as needed for cough., Starting Sun 10/24/2016, Print         Melony Overly, MD 10/24/16 1311

## 2017-02-24 ENCOUNTER — Ambulatory Visit (HOSPITAL_COMMUNITY)
Admission: EM | Admit: 2017-02-24 | Discharge: 2017-02-24 | Disposition: A | Discharge status: Home / Self Care | Payer: PRIVATE HEALTH INSURANCE | Attending: Family Medicine | Admitting: Family Medicine

## 2017-02-24 ENCOUNTER — Encounter (HOSPITAL_COMMUNITY): Payer: Self-pay | Admitting: *Deleted

## 2017-02-24 DIAGNOSIS — J0110 Acute frontal sinusitis, unspecified: Secondary | ICD-10-CM | POA: Diagnosis not present

## 2017-02-24 DIAGNOSIS — J40 Bronchitis, not specified as acute or chronic: Secondary | ICD-10-CM

## 2017-02-24 MED ORDER — AMOXICILLIN 500 MG PO CAPS: 500.0000 mg | ORAL_CAPSULE | Freq: Three times a day (TID) | ORAL | 0 refills | Status: DC

## 2017-02-24 MED ORDER — ALBUTEROL SULFATE HFA 108 (90 BASE) MCG/ACT IN AERS: 1.0000 | INHALATION_SPRAY | Freq: Four times a day (QID) | RESPIRATORY_TRACT | 0 refills | Status: DC | PRN

## 2017-02-24 MED ORDER — PREDNISONE 10 MG (21) PO TBPK: ORAL_TABLET | Freq: Every day | ORAL | 0 refills | Status: DC

## 2017-02-24 MED ORDER — DEXTROMETHORPHAN HBR 15 MG/5ML PO SYRP: 10.0000 mL | ORAL_SOLUTION | Freq: Four times a day (QID) | ORAL | 0 refills | Status: DC | PRN

## 2017-02-24 NOTE — ED Provider Notes (Signed)
CSN: [id number]     Arrival date & time 02/24/17  1344 History   First MD Initiated Contact with Patient 02/24/17 1357     No chief complaint on file.  (Consider location/radiation/quality/duration/timing/severity/associated sxs/prior Treatment) Pt here from work with sinus pressure, body aches, cough, congestion, and unknown fever for 4 days. Has had several sinus infections in the past. Taking OTC tylenol and cold with no relief. Pt appears sick and hot  To touch.       Past Medical History:  Diagnosis Date  . Asthma   . HSV-2 (herpes simplex virus 2) infection   . Migraines    Past Surgical History:  Procedure Laterality Date  . CESAREAN SECTION    . CESAREAN SECTION  '99, '00 ,'02   x 3  . TUBAL LIGATION     No family history on file. Social History  Substance Use Topics  . Smoking status: Never Smoker  . Smokeless tobacco: Never Used  . Alcohol use No   OB History    No data available     Review of Systems  Constitutional: Positive for fatigue.  HENT: Positive for congestion, ear pain, sinus pain and sinus pressure.   Eyes: Negative.   Respiratory: Positive for cough.   Cardiovascular: Negative.   Gastrointestinal: Negative.   Genitourinary: Negative.   Skin: Negative.   Neurological: Negative.     Allergies  Patient has no known allergies.  Home Medications   Prior to Admission medications   Medication Sig Start Date End Date Taking? Authorizing Provider  albuterol (PROVENTIL HFA;VENTOLIN HFA) 108 (90 Base) MCG/ACT inhaler Inhale 1-2 puffs into the lungs every 6 (six) hours as needed for wheezing or shortness of breath. 02/24/17   Melanee Left, NP  amoxicillin (AMOXIL) 500 MG capsule Take 1 capsule (500 mg total) by mouth 3 (three) times daily. 02/24/17   Melanee Left, NP  azithromycin (ZITHROMAX Z-PAK) 250 MG tablet Take 2 pills today, then 1 pill daily until gone. 10/24/16   Melony Overly, MD  chlorpheniramine-HYDROcodone (TUSSIONEX PENNKINETIC  ER) 10-8 MG/5ML SUER Take 5 mLs by mouth every 12 (twelve) hours as needed for cough. 10/24/16   Melony Overly, MD  dextromethorphan 15 MG/5ML syrup Take 10 mLs (30 mg total) by mouth 4 (four) times daily as needed for cough. 02/24/17   Melanee Left, NP  diazepam (VALIUM) 5 MG tablet Take 1 tablet (5 mg total) by mouth every 8 (eight) hours as needed (neck spasm). 01/09/13   Etta Quill, NP  ibuprofen (ADVIL,MOTRIN) 200 MG tablet Take 200 mg by mouth every 6 (six) hours as needed for pain.    Historical Provider, MD  predniSONE (STERAPRED UNI-PAK 21 TAB) 10 MG (21) TBPK tablet Take by mouth daily. Take 6 tabs by mouth daily  for 2 days, then 5 tabs for 2 days, then 4 tabs for 2 days, then 3 tabs for 2 days, 2 tabs for 2 days, then 1 tab by mouth daily for 2 days 02/24/17   Melanee Left, NP  valACYclovir (VALTREX) 1000 MG tablet Take 1,000 mg by mouth daily.     Historical Provider, MD   Meds Ordered and Administered this Visit  Medications - No data to display  BP 120/78 (BP Location: Right Arm)   Pulse 76   Temp 98.9 F (37.2 C) (Oral)   Resp 14   SpO2 98%  No data found.   Physical Exam  Constitutional: She appears well-developed.  HENT:  Head: Normocephalic.  Right Ear: External ear normal.  Left Ear: External ear normal.  Mouth/Throat: Oropharynx is clear and moist.  Eyes: Pupils are equal, round, and reactive to light.  Neck: Normal range of motion.  Cardiovascular: Normal rate and regular rhythm.   Pulmonary/Chest: Effort normal and breath sounds normal.  Abdominal: Soft.  Musculoskeletal: Normal range of motion.  Neurological: She is alert.  Skin: Skin is warm.    Urgent Care Course     Procedures (including critical care time)  Labs Review Labs Reviewed - No data to display  Imaging Review No results found.           MDM   1. Bronchitis   2. Acute non-recurrent frontal sinusitis     You have been taking tylenol but feel warm and may have an  fever. Monitor this if symptoms get worse or you begin to have a fever you can take the antibiotics. Stay hydrated May use a [place] Jovoni Borkenhagen, NP 02/24/17 1407

## 2017-02-24 NOTE — Discharge Instructions (Signed)
You have been taking tylenol but feel warm and may have an fever. Monitor this if symptoms get worse or you begin to have a fever you can take the antibiotics. Stay hydrated May use a humidifier

## 2017-02-24 NOTE — ED Notes (Signed)
ED Provider at bedside. 

## 2017-02-24 NOTE — ED Triage Notes (Signed)
Pt  Reports   Symptoms  Of  Cough  /  Congestion  As   Well    As  Nasal  Drainage  And  Stuffy   Nose    Pt  Has  A  History  Of  Asthma  In  Past

## 2017-12-01 DIAGNOSIS — M25522 Pain in left elbow: Secondary | ICD-10-CM | POA: Insufficient documentation

## 2017-12-01 DIAGNOSIS — G5622 Lesion of ulnar nerve, left upper limb: Secondary | ICD-10-CM | POA: Insufficient documentation

## 2017-12-13 DIAGNOSIS — M25552 Pain in left hip: Secondary | ICD-10-CM | POA: Insufficient documentation

## 2017-12-20 DIAGNOSIS — M5416 Radiculopathy, lumbar region: Secondary | ICD-10-CM | POA: Insufficient documentation

## 2017-12-29 ENCOUNTER — Other Ambulatory Visit: Payer: Self-pay | Admitting: Orthopaedic Surgery

## 2017-12-29 ENCOUNTER — Ambulatory Visit
Admission: RE | Admit: 2017-12-29 | Discharge: 2017-12-29 | Disposition: A | Discharge status: Home / Self Care | Payer: PRIVATE HEALTH INSURANCE | Source: Ambulatory Visit | Attending: Orthopaedic Surgery | Admitting: Orthopaedic Surgery

## 2017-12-29 DIAGNOSIS — M5442 Lumbago with sciatica, left side: Principal | ICD-10-CM

## 2017-12-29 DIAGNOSIS — G8929 Other chronic pain: Secondary | ICD-10-CM

## 2018-05-29 ENCOUNTER — Other Ambulatory Visit: Payer: Self-pay | Admitting: Obstetrics & Gynecology

## 2018-05-29 ENCOUNTER — Ambulatory Visit
Admission: RE | Admit: 2018-05-29 | Discharge: 2018-05-29 | Disposition: A | Discharge status: Home / Self Care | Payer: PRIVATE HEALTH INSURANCE | Source: Ambulatory Visit | Attending: Obstetrics & Gynecology | Admitting: Obstetrics & Gynecology

## 2018-05-29 DIAGNOSIS — Z1231 Encounter for screening mammogram for malignant neoplasm of breast: Secondary | ICD-10-CM

## 2018-10-12 ENCOUNTER — Emergency Department (HOSPITAL_COMMUNITY): Payer: PRIVATE HEALTH INSURANCE

## 2018-10-12 ENCOUNTER — Other Ambulatory Visit: Payer: Self-pay

## 2018-10-12 ENCOUNTER — Emergency Department (HOSPITAL_COMMUNITY)
Admission: EM | Admit: 2018-10-12 | Discharge: 2018-10-12 | Disposition: A | Discharge status: Home / Self Care | Payer: PRIVATE HEALTH INSURANCE | Attending: Emergency Medicine | Admitting: Emergency Medicine

## 2018-10-12 ENCOUNTER — Encounter (HOSPITAL_COMMUNITY): Payer: Self-pay | Admitting: Emergency Medicine

## 2018-10-12 DIAGNOSIS — Z7982 Long term (current) use of aspirin: Secondary | ICD-10-CM | POA: Insufficient documentation

## 2018-10-12 DIAGNOSIS — J45909 Unspecified asthma, uncomplicated: Secondary | ICD-10-CM | POA: Diagnosis not present

## 2018-10-12 DIAGNOSIS — R079 Chest pain, unspecified: Secondary | ICD-10-CM

## 2018-10-12 LAB — HEPATIC FUNCTION PANEL
ALK PHOS: 56 U/L (ref 38–126)
ALT: 19 U/L (ref 0–44)
AST: 23 U/L (ref 15–41)
Albumin: 4.2 g/dL (ref 3.5–5.0)
Bilirubin, Direct: <0.1 mg/dL (ref 0.0–0.2)
Total Bilirubin: 0.6 mg/dL (ref 0.3–1.2)
Total Protein: 8 g/dL (ref 6.5–8.1)

## 2018-10-12 LAB — BASIC METABOLIC PANEL
ANION GAP: 8 (ref 5–15)
BUN: 5 mg/dL — AB (ref 6–20)
CO2: 23 mmol/L (ref 22–32)
Calcium: 9.3 mg/dL (ref 8.9–10.3)
Chloride: 105 mmol/L (ref 98–111)
Creatinine, Ser: 0.84 mg/dL (ref 0.44–1.00)
Glucose, Bld: 94 mg/dL (ref 70–99)
POTASSIUM: 3.5 mmol/L (ref 3.5–5.1)
Sodium: 136 mmol/L (ref 135–145)

## 2018-10-12 LAB — I-STAT BETA HCG BLOOD, ED (MC, WL, AP ONLY): I-stat hCG, quantitative: 8.5 m[IU]/mL — ABNORMAL HIGH (ref <5)

## 2018-10-12 LAB — I-STAT TROPONIN, ED
Troponin i, poc: 0 ng/mL (ref 0.00–0.08)
Troponin i, poc: 0 ng/mL (ref 0.00–0.08)

## 2018-10-12 LAB — CBC
HCT: 43 % (ref 36.0–46.0)
HEMOGLOBIN: 14.5 g/dL (ref 12.0–15.0)
MCH: 29.3 pg (ref 26.0–34.0)
MCHC: 33.7 g/dL (ref 30.0–36.0)
MCV: 86.9 fL (ref 80.0–100.0)
Platelets: 314 10*3/uL (ref 150–400)
RBC: 4.95 MIL/uL (ref 3.87–5.11)
RDW: 12.4 % (ref 11.5–15.5)
WBC: 7.1 10*3/uL (ref 4.0–10.5)
nRBC: 0 % (ref 0.0–0.2)

## 2018-10-12 LAB — PREGNANCY, URINE: PREG TEST UR: NEGATIVE

## 2018-10-12 MED ORDER — KETOROLAC TROMETHAMINE 30 MG/ML IJ SOLN
30.0000 mg | Freq: Once | INTRAMUSCULAR | Status: AC
  Administered 2018-10-12: 30 mg via INTRAVENOUS
  Filled 2018-10-12: qty 1

## 2018-10-12 MED ORDER — ALUM & MAG HYDROXIDE-SIMETH 200-200-20 MG/5ML PO SUSP
30.0000 mL | Freq: Once | ORAL | Status: AC
  Administered 2018-10-12: 30 mL via ORAL
  Filled 2018-10-12: qty 30

## 2018-10-12 MED ORDER — LIDOCAINE VISCOUS HCL 2 % MT SOLN
15.0000 mL | Freq: Once | OROMUCOSAL | Status: AC
  Administered 2018-10-12: 15 mL via ORAL
  Filled 2018-10-12: qty 15

## 2018-10-12 NOTE — ED Notes (Signed)
Patient transported to radiology

## 2018-10-12 NOTE — ED Notes (Signed)
EKG completed given to Dr Maryan Rued

## 2018-10-12 NOTE — ED Triage Notes (Signed)
Pt arrives via POV from work, developed sudden onset substernal chest pain radiating to her back, initally described as burning, now with tightness and pressure. tok 324mg  ASA at work. Denies medical hx.

## 2018-10-12 NOTE — ED Notes (Signed)
Patient verbalizes understanding of discharge instructions. Opportunity for questioning and answers were provided. Armband removed by staff, pt discharged from ED. Pt ambulatory to lobby with friend.

## 2018-10-12 NOTE — Discharge Instructions (Signed)
It was my pleasure taking care of you today!   You were seen in the Emergency Department today for chest pain.  As we have discussed, todays blood work and imaging are normal, but you may require further testing.  Please call your primary care physician to schedule a follow up appointment to discuss your ER visit today.   Return to the Emergency Department if you experience any further chest pain/pressure/tightness, difficulty breathing, sudden sweating, or other symptoms that concern you.

## 2018-10-12 NOTE — ED Provider Notes (Signed)
[place] EMERGENCY DEPARTMENT Provider Note   CSN: [id number] Arrival date & time: 10/12/18  1119     History   Chief Complaint Chief Complaint  Patient presents with  . Chest Pain    HPI Mallory Acevedo is a 37 y.o. female.  The history is provided by the patient and medical records. No language interpreter was used.  Chest Pain     Mallory Acevedo is a 37 y.o. female  with a PMH of asthma who presents to the Emergency Department complaining of acute onset of central chest pain about an hour prior to ER arrival.  Nonradiating.  Feels like a burning, squeezing.  Denies any history of similar.  Denies any associated shortness of breath, nausea, vomiting, diaphoresis, back pain or abdominal pain. She was given a 325 aspirin by coworkers prior to arrival with little improvement.  Patient states that her pain is worse with any type of movement.  No history of hypertension, hyperlipidemia, diabetes or heart disease.  No leg swelling or extremity pain.  No recent surgeries/travel/immobilizations.  She has an IUD for birth control.  Has a sister who recently had a hole in her heart repaired, but otherwise no immediate family with cardiac history.    Past Medical History:  Diagnosis Date  . Asthma   . HSV-2 (herpes simplex virus 2) infection   . Migraines     There are no active problems to display for this patient.   Past Surgical History:  Procedure Laterality Date  . CESAREAN SECTION    . CESAREAN SECTION  '99, '00 ,'02   x 3  . TUBAL LIGATION       OB History   None      Home Medications    Prior to Admission medications   Medication Sig Start Date End Date Taking? Authorizing Provider  aspirin EC 325 MG tablet Take 325 mg by mouth daily as needed (chest pain).   Yes [provider]  ibuprofen (ADVIL,MOTRIN) 200 MG tablet Take 200 mg by mouth every 6 (six) hours as needed for pain.    [provider]    Family  History History reviewed. No pertinent family history.  Social History Social History   Tobacco Use  . Smoking status: Never Smoker  . Smokeless tobacco: Never Used  Substance Use Topics  . Alcohol use: Yes    Comment: rarely  . Drug use: No     Allergies   Patient has no known allergies.   Review of Systems Review of Systems  Cardiovascular: Positive for chest pain.  All other systems reviewed and are negative.    Physical Exam Updated Vital Signs BP 116/71   Pulse 69   Temp 98.3 F (36.8 C) (Oral)   Resp 18   Ht 5\' 2"  (1.575 m)   Wt 62.1 kg   SpO2 100%   BMI 25.06 kg/m   Physical Exam  Constitutional: She is oriented to person, place, and time. She appears well-developed and well-nourished. No distress.  HENT:  Head: Normocephalic and atraumatic.  Cardiovascular: Normal rate, regular rhythm and normal heart sounds.  No murmur heard. Pulmonary/Chest: Effort normal and breath sounds normal. No respiratory distress.    Abdominal: Soft. She exhibits no distension. There is no tenderness.  Musculoskeletal: She exhibits no edema.  Neurological: She is alert and oriented to person, place, and time.  Skin: Skin is warm and dry.  Nursing note and vitals reviewed.    ED  Treatments / Results  Labs (all labs ordered are listed, but only abnormal results are displayed) Labs Reviewed  BASIC METABOLIC PANEL - Abnormal; Notable for the following components:      Result Value   BUN 5 (*)    All other components within normal limits  I-STAT BETA HCG BLOOD, ED ([facility], [facility], [facility] ONLY) - Abnormal; Notable for the following components:   I-stat hCG, quantitative 8.5 (*)    All other components within normal limits  CBC  PREGNANCY, URINE  HEPATIC FUNCTION PANEL  I-STAT TROPONIN, ED  I-STAT TROPONIN, ED    EKG None    Date: 10/12/2018  Rate: 76  Rhythm: normal sinus rhythm  QRS Axis: normal  Intervals: normal  ST/T Wave abnormalities: normal  Conduction  Disutrbances: none  Narrative Interpretation: Artifact present, no stemi  EKG reviewed with attending, Dr. Maryan Rued, who agrees with above interpretation.   Radiology Dg Chest 2 View  Result Date: 10/12/2018 CLINICAL DATA:  Mid to left chest pain. EXAM: CHEST - 2 VIEW COMPARISON:  08/12/2013 FINDINGS: Heart and mediastinal contours are within normal limits. No focal opacities or effusions. No acute bony abnormality. IMPRESSION: No active cardiopulmonary disease. Electronically Signed   By: Rolm Baptise M.D.   On: 10/12/2018 12:02    Procedures Procedures (including critical care time)  Medications Ordered in ED Medications  ketorolac (TORADOL) 30 MG/ML injection 30 mg (has no administration in time range)  alum & mag hydroxide-simeth (MAALOX/MYLANTA) 200-200-20 MG/5ML suspension 30 mL (30 mLs Oral Given 10/12/18 1213)    And  lidocaine (XYLOCAINE) 2 % viscous mouth solution 15 mL (15 mLs Oral Given 10/12/18 1213)     Initial Impression / Assessment and Plan / ED Course  I have reviewed the triage vital signs and the nursing notes.  Pertinent labs & imaging results that were available during my care of the patient were reviewed by me and considered in my medical decision making (see chart for details).     Mallory Acevedo is a 37 y.o. female who presents to ED for constant, central chest pain beginning about an hour prior to ER arrival. On exam, patient is afebrile and hemodynamically stable.  She does have tenderness to the central chest/upper abdomen.  Pain is worse with palpation and any movement, even torso rotation.  Labs reviewed and reassuring with negative troponin x2.  CXR with no acute abnormalities.  EKG non-ischemic.   Low risk heart score of 1. PERC negative  Patient's symptoms unlikely to be of cardiac etiology. Labs and imaging reviewed again prior to discharge. Patient has been advised to return to the ED if development of any exertional chest pain, trouble  breathing, new/worsening symptoms or for any additional concerns. Evaluation does not show pathology that would require ongoing emergent intervention or inpatient treatment. Encouraged to follow up with her PCP. Patient understands return precautions and follow up plan. All questions answered.     Final Clinical Impressions(s) / ED Diagnoses   Final diagnoses:  Chest pain with low risk for cardiac etiology    ED Discharge Orders    None       Akaya Proffit, Ozella Almond, PA-C 10/12/18 1519    Blanchie Dessert, MD 10/12/18 1524

## 2018-10-12 NOTE — ED Notes (Signed)
Called lab on status of hepatic function panel. Stated will have results shortly.

## 2018-12-01 DIAGNOSIS — R252 Cramp and spasm: Secondary | ICD-10-CM | POA: Insufficient documentation

## 2018-12-01 DIAGNOSIS — M25541 Pain in joints of right hand: Secondary | ICD-10-CM | POA: Insufficient documentation

## 2019-01-12 ENCOUNTER — Other Ambulatory Visit: Payer: Self-pay | Admitting: Rheumatology

## 2019-01-12 DIAGNOSIS — M549 Dorsalgia, unspecified: Secondary | ICD-10-CM

## 2019-01-14 ENCOUNTER — Ambulatory Visit
Admission: RE | Admit: 2019-01-14 | Discharge: 2019-01-14 | Disposition: A | Discharge status: Home / Self Care | Payer: PRIVATE HEALTH INSURANCE | Source: Ambulatory Visit | Attending: Rheumatology | Admitting: Rheumatology

## 2019-01-14 DIAGNOSIS — M549 Dorsalgia, unspecified: Secondary | ICD-10-CM

## 2019-01-14 MED ORDER — GADOBENATE DIMEGLUMINE 529 MG/ML IV SOLN
12.0000 mL | Freq: Once | INTRAVENOUS | Status: AC | PRN
  Administered 2019-01-14: 12 mL via INTRAVENOUS

## 2019-01-20 ENCOUNTER — Other Ambulatory Visit: Payer: PRIVATE HEALTH INSURANCE

## 2019-02-19 ENCOUNTER — Other Ambulatory Visit: Payer: Self-pay | Admitting: Obstetrics & Gynecology

## 2019-02-19 DIAGNOSIS — N644 Mastodynia: Secondary | ICD-10-CM

## 2019-02-20 ENCOUNTER — Other Ambulatory Visit: Payer: Self-pay

## 2019-02-20 ENCOUNTER — Ambulatory Visit: Payer: PRIVATE HEALTH INSURANCE

## 2019-02-20 ENCOUNTER — Ambulatory Visit
Admission: RE | Admit: 2019-02-20 | Discharge: 2019-02-20 | Disposition: A | Discharge status: Home / Self Care | Payer: PRIVATE HEALTH INSURANCE | Source: Ambulatory Visit | Attending: Obstetrics & Gynecology | Admitting: Obstetrics & Gynecology

## 2019-02-20 DIAGNOSIS — N644 Mastodynia: Secondary | ICD-10-CM

## 2019-04-24 ENCOUNTER — Ambulatory Visit (INDEPENDENT_AMBULATORY_CARE_PROVIDER_SITE_OTHER): Payer: PRIVATE HEALTH INSURANCE

## 2019-04-24 ENCOUNTER — Ambulatory Visit: Payer: PRIVATE HEALTH INSURANCE | Admitting: Podiatry

## 2019-04-24 ENCOUNTER — Other Ambulatory Visit: Payer: Self-pay

## 2019-04-24 ENCOUNTER — Encounter: Payer: Self-pay | Admitting: Podiatry

## 2019-04-24 VITALS — Temp 97.5°F

## 2019-04-24 DIAGNOSIS — M2042 Other hammer toe(s) (acquired), left foot: Secondary | ICD-10-CM

## 2019-04-24 DIAGNOSIS — M79674 Pain in right toe(s): Secondary | ICD-10-CM

## 2019-04-24 DIAGNOSIS — M79675 Pain in left toe(s): Secondary | ICD-10-CM

## 2019-04-24 DIAGNOSIS — M2041 Other hammer toe(s) (acquired), right foot: Secondary | ICD-10-CM | POA: Diagnosis not present

## 2019-04-24 DIAGNOSIS — L84 Corns and callosities: Secondary | ICD-10-CM

## 2019-04-24 NOTE — Patient Instructions (Addendum)
Corns and Calluses Corns are small areas of thickened skin that occur on the top, sides, or tip of a toe. They contain a cone-shaped core with a point that can press on a nerve below. This causes pain.  Calluses are areas of thickened skin that can occur anywhere on the body, including the hands, fingers, palms, soles of the feet, and heels. Calluses are usually larger than corns. What are the causes? Corns and calluses are caused by rubbing (friction) or pressure, such as from shoes that are too tight or do not fit properly. What increases the risk? Corns are more likely to develop in people who have misshapen toes (toe deformities), such as hammer toes. Calluses can occur with friction to any area of the skin. They are more likely to develop in people who:  Work with their hands.  Wear shoes that fit poorly, are too tight, or are high-heeled.  Have toe deformities. What are the signs or symptoms? Symptoms of a corn or callus include:  A hard growth on the skin.  Pain or tenderness under the skin.  Redness and swelling.  Increased discomfort while wearing tight-fitting shoes, if your feet are affected. If a corn or callus becomes infected, symptoms may include:  Redness and swelling that gets worse.  Pain.  Fluid, blood, or pus draining from the corn or callus. How is this diagnosed? Corns and calluses may be diagnosed based on your symptoms, your medical history, and a physical exam. How is this treated? Treatment for corns and calluses may include:  Removing the cause of the friction or pressure. This may involve: ? Changing your shoes. ? Wearing shoe inserts (orthotics) or other protective layers in your shoes, such as a corn pad. ? Wearing gloves.  Applying medicine to the skin (topical medicine) to help soften skin in the hardened, thickened areas.  Removing layers of dead skin with a file to reduce the size of the corn or callus.  Removing the corn or callus with a  scalpel or laser.  Taking antibiotic medicines, if your corn or callus is infected.  Having surgery, if a toe deformity is the cause. Follow these instructions at home:   Take over-the-counter and prescription medicines only as told by your health care provider.  If you were prescribed an antibiotic, take it as told by your health care provider. Do not stop taking it even if your condition starts to improve.  Wear shoes that fit well. Avoid wearing high-heeled shoes and shoes that are too tight or too loose.  Wear any padding, protective layers, gloves, or orthotics as told by your health care provider.  Soak your hands or feet and then use a file or pumice stone to soften your corn or callus. Do this as told by your health care provider.  Check your corn or callus every day for symptoms of infection. Contact a health care provider if you:  Notice that your symptoms do not improve with treatment.  Have redness or swelling that gets worse.  Notice that your corn or callus becomes painful.  Have fluid, blood, or pus coming from your corn or callus.  Have new symptoms. Summary  Corns are small areas of thickened skin that occur on the top, sides, or tip of a toe.  Calluses are areas of thickened skin that can occur anywhere on the body, including the hands, fingers, palms, and soles of the feet. Calluses are usually larger than corns.  Corns and calluses are caused by   rubbing (friction) or pressure, such as from shoes that are too tight or do not fit properly.  Treatment may include wearing any padding, protective layers, gloves, or orthotics as told by your health care provider. This information is not intended to replace advice given to you by your health care provider. Make sure you discuss any questions you have with your health care provider. Document Released: 08/14/2004 Document Revised: 09/21/2017 Document Reviewed: 09/21/2017 Elsevier Interactive Patient Education   2019 [place] Toe  Hammer toe is a change in the shape (a deformity) of your toe. The deformity causes the middle joint of your toe to stay bent. This causes pain, especially when you are wearing shoes. Hammer toe starts gradually. At first, the toe can be straightened. Gradually over time, the deformity becomes stiff and permanent. Early treatments to keep the toe straight may relieve pain. As the deformity becomes stiff and permanent, surgery may be needed to straighten the toe. What are the causes? Hammer toe is caused by abnormal bending of the toe joint that is closest to your foot. It happens gradually over time. This pulls on the muscles and connections (tendons) of the toe joint, making them weak and stiff. It is often related to wearing shoes that are too short or narrow and do not let your toes straighten. What increases the risk? You may be at greater risk for hammer toe if you:  Are female.  Are older.  Wear shoes that are too small.  Wear high-heeled shoes that pinch your toes.  Are a Engineer, mining.  Have a second toe that is longer than your big toe (first toe).  Injure your foot or toe.  Have arthritis.  Have a family history of hammer toe.  Have a nerve or muscle disorder. What are the signs or symptoms? The main symptoms of this condition are pain and deformity of the toe. The pain is worse when wearing shoes, walking, or running. Other symptoms may include:  Corns or calluses over the bent part of the toe or between the toes.  Redness and a burning feeling on the toe.  An open sore that forms on the top of the toe.  Not being able to straighten the toe. How is this diagnosed? This condition is diagnosed based on your symptoms and a physical exam. During the exam, your health care provider will try to straighten your toe to see how stiff the deformity is. You may also have tests, such as:  A blood test to check for rheumatoid arthritis.  An  X-ray to show how severe the deformity is. How is this treated? Treatment for this condition will depend on how stiff the deformity is. Surgery is often needed. However, sometimes a hammer toe can be straightened without surgery. Treatments that do not involve surgery include:  Taping the toe into a straightened position.  Using pads and cushions to protect the toe (orthotics).  Wearing shoes that provide enough room for the toes.  Doing toe-stretching exercises at home.  Taking an NSAID to reduce pain and swelling. If these treatments do not help or the toe cannot be straightened, surgery is the next option. The most common surgeries used to straighten a hammer toe include:  Arthroplasty. In this procedure, part of the joint is removed, and that allows the toe to straighten.  Fusion. In this procedure, cartilage between the two bones of the joint is taken out and the bones are fused together into one longer  bone.  Implantation. In this procedure, part of the bone is removed and replaced with an implant to let the toe move again.  Flexor tendon transfer. In this procedure, the tendons that curl the toes down (flexor tendons) are repositioned. Follow these instructions at home:  Take over-the-counter and prescription medicines only as told by your health care provider.  Do toe straightening and stretching exercises as told by your health care provider.  Keep all follow-up visits as told by your health care provider. This is important. How is this prevented?  Wear shoes that give your toes enough room and do not cause pain.  Do not wear high-heeled shoes. Contact a health care provider if:  Your pain gets worse.  Your toe becomes red or swollen.  You develop an open sore on your toe. This information is not intended to replace advice given to you by your health care provider. Make sure you discuss any questions you have with your health care provider. Document Released:  11/05/2000 Document Revised: 06/06/2017 Document Reviewed: 03/03/2016 Elsevier Interactive Patient Education  [employer].

## 2019-04-29 NOTE — Progress Notes (Signed)
Subjective: Mallory Acevedo presents today with cc of painful 5th digits. She states her corns are painful and have been on and off for about 1 year in duration. Aggravating factors include heels/dress shoes. She has tried trimming with razor and using softening cream with no relief of symptoms. Pain is getting progressively worse. Patient is seeking professional help regarding symptoms.  Her PCP is Adline Mango, MD and last visit was 10/12/2018.  Past Medical History:  Diagnosis Date  . Asthma   . HSV-2 (herpes simplex virus 2) infection   . Migraines      Patient Active Problem List   Diagnosis Date Noted  . Joint pain in fingers of both hands 12/01/2018  . Muscle cramps 12/01/2018  . Lumbar radiculopathy 12/20/2017  . Left hip pain 12/13/2017  . Left elbow pain 12/01/2017  . 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 Vitamin D deficiency 04/30/2013  . Adenomyoma of uterus 03/01/2012  . Asthma, well controlled 03/01/2012  . History of pregnancy 03/01/2012  . Migraine syndrome 03/01/2012  . Presence of (intrauterine) contraceptive device 03/01/2012  . Urinary tract infection 03/01/2012     Past Surgical History:  Procedure Laterality Date  . CESAREAN SECTION    . CESAREAN SECTION  '99, '00 ,'02   x 3  . TUBAL LIGATION        Current Outpatient Medications:  .  acyclovir ointment (ZOVIRAX) 5 %, Apply to area three times a day for 7 days when has rash, Disp: , Rfl:  .  aspirin EC 325 MG tablet, Take 325 mg by mouth daily as needed (chest pain)., Disp: , Rfl:  .  folic acid (FOLVITE) 1 MG tablet, , Disp: , Rfl:  .  ibuprofen (ADVIL,MOTRIN) 200 MG tablet, Take 200 mg by mouth every 6 (six) hours as needed for pain., Disp: , Rfl:  .  methotrexate (RHEUMATREX) 2.5 MG tablet, , Disp: , Rfl:  .  naproxen (NAPROSYN) 500 MG tablet, , Disp: , Rfl:  .  nitrofurantoin, macrocrystal-monohydrate, (MACROBID) 100 MG capsule, Take one pill after intercourse as needed, Disp: ,  Rfl:  .  valACYclovir (VALTREX) 500 MG tablet, Take by mouth., Disp: , Rfl:    No Known Allergies   Social History   Occupational History  . Not on file  Tobacco Use  . Smoking status: Never Smoker  . Smokeless tobacco: Never Used  Substance and Sexual Activity  . Alcohol use: Yes    Comment: rarely  . Drug use: No  . Sexual activity: Yes    Birth control/protection: Surgical, I.U.D.     History reviewed. No pertinent family history.    There is no immunization history on file for this patient.   Review of systems: Positive Findings in bold print.  Constitutional:  chills, fatigue, fever, sweats, weight change Communication: Optometrist, sign Ecologist, hand writing, iPad/Android device Head: headaches, head injury Eyes: changes in vision, eye pain, glaucoma, cataracts, macular degeneration, diplopia, glare,  light sensitivity, eyeglasses or contacts, blindness Ears nose mouth throat: hearing impaired, hearing aids,  ringing in ears, deaf, sign language,  vertigo,   nosebleeds,  rhinitis,  cold sores, snoring, swollen glands Cardiovascular: HTN, edema, arrhythmia, pacemaker in place, defibrillator in place, chest pain/tightness, chronic anticoagulation, blood clot, heart failure, MI Peripheral Vascular: leg cramps, varicose veins, blood clots, lymphedema, varicosities Respiratory:  difficulty breathing, denies congestion, SOB, wheezing, cough, emphysema, asthma Gastrointestinal: change in appetite or weight, abdominal pain, constipation, diarrhea, nausea, vomiting, vomiting blood,  change in bowel habits, abdominal pain, jaundice, rectal bleeding, hemorrhoids, GERD Genitourinary:  nocturia,  pain on urination, polyuria,  blood in urine, Foley catheter, urinary urgency, ESRD on hemodialysis Musculoskeletal: amputation, cramping, stiff joints, painful joints, decreased joint motion, fractures, OA, gout, hemiplegia, paraplegia, uses cane, wheelchair bound, uses walker, uses  rollator Skin: +changes in toenails, color change, dryness, itching, mole changes,  rash, wound(s) Neurological: headaches, numbness in feet, paresthesias in feet, burning in feet, fainting,  seizures, change in speech, migraines, headaches, memory problems/poor historian, cerebral palsy, weakness, paralysis, CVA, TIA Endocrine: diabetes, hypothyroidism, hyperthyroidism,  goiter, dry mouth, flushing, heat intolerance,  cold intolerance,  excessive thirst, denies polyuria,  nocturia Hematological:  easy bleeding, excessive bleeding, easy bruising, enlarged lymph nodes, on long term blood thinner, history of past transusions Allergy/immunological:  hives, eczema, frequent infections, multiple drug allergies, seasonal allergies, transplant recipient, multiple food allergies Psychiatric:  anxiety, depression, mood disorder, suicidal ideations, hallucinations, insomnia  Objective: Vitals:   04/24/19 1619  Temp: (!) 97.5 F (36.4 C)    Vascular Examination: Capillary refill time immediate  x 10 digits.  Dorsalis pedis and posterior tibial pulses present b/l.  Digital hair present  x 10 digits  Skin temperature WNL b/l.  Dermatological Examination: Skin with normal turgor, texture and tone b/l.  Toenails 1-5 b/l are well manicured.  Hyperkeratotic lesions dorsal 5th PIPJ with tenderness to palpation. No erythema, no edema, no drainage, no flocculence noted.  Musculoskeletal: Muscle strength 5/5 to all LE muscle groups.  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  Neurological: Sensation intact 5/5 b/l with 10 gram monofilament.  Vibratory sensation intact b/l.  Proprioceptive sensation intact b/l.  Xray b/l feet: adductovarus deformity of bilateral 5th digits at PIPJ  Assessment: 1. Painful corns b/l 5th digits 2.  Hammertoe deformity b/l 5th digits 3.  Pain in right/left toes  Plan: 1. Bilateral foot xrays taken and reviewed with Ms. Midkiff.  2. Discussed  diagnosis of hammertoes with Ms. Febres. Corns pared as courtesy today. Dispensed silicone toe caps for protection when she wears her shoe gear.  Discussed conservative vs surgical treatment of hammertoe correction. She would like to know her financial responsibility for surgical correction. I will refer her to billing dept for that information. She will let us know when she is ready to proceed. Will refer to Dr. Cannon Kettle for consultation when patient is ready to proceed. 3. Patient to continue soft, supportive shoe gear daily. 4. Patient to report any pedal injuries to medical professional immediately. 5. Follow up prn. 6. Patient/POA to call should there be a concern in the interim.

## 2019-05-22 ENCOUNTER — Ambulatory Visit: Payer: PRIVATE HEALTH INSURANCE | Admitting: Sports Medicine

## 2019-05-22 ENCOUNTER — Encounter: Payer: Self-pay | Admitting: Sports Medicine

## 2019-05-22 ENCOUNTER — Other Ambulatory Visit: Payer: Self-pay

## 2019-05-22 VITALS — Temp 98.1°F

## 2019-05-22 DIAGNOSIS — M2041 Other hammer toe(s) (acquired), right foot: Secondary | ICD-10-CM

## 2019-05-22 DIAGNOSIS — M79675 Pain in left toe(s): Secondary | ICD-10-CM | POA: Diagnosis not present

## 2019-05-22 DIAGNOSIS — M79674 Pain in right toe(s): Secondary | ICD-10-CM

## 2019-05-22 DIAGNOSIS — M2042 Other hammer toe(s) (acquired), left foot: Secondary | ICD-10-CM

## 2019-05-22 DIAGNOSIS — L84 Corns and callosities: Secondary | ICD-10-CM | POA: Diagnosis not present

## 2019-05-22 NOTE — Progress Notes (Signed)
Subjective: Mallory Acevedo is a 38 y.o. female patient who presents to office for evaluation of Right> Left foot pain.  Patient admits to continued pain at both her baby toes over the last year with the last few months becoming most painful reports that she saw Dr. Adah Perl last visit who provided care of trimming the corn areas and cushions however the secretions seem to give some protection but the toes burn and hurt really badly especially with shoes and direct pressure at the corn sites on the baby toes.  Patient reports that she has tried wide shoes in addition to self trimming and topical skin creams with no improvement.  Patient is here to discuss surgery options.  Patient denies any other pedal complaints.   Review of Systems  Musculoskeletal: Positive for joint pain.  Skin: Positive for itching.       Corn to toes  All other systems reviewed and are negative.    Patient Active Problem List   Diagnosis Date Noted  . Joint pain in fingers of both hands 12/01/2018  . Muscle cramps 12/01/2018  . Lumbar radiculopathy 12/20/2017  . Left hip pain 12/13/2017  . Left elbow pain 12/01/2017  . 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 Vitamin D deficiency 04/30/2013  . Adenomyoma of uterus 03/01/2012  . Asthma, well controlled 03/01/2012  . History of pregnancy 03/01/2012  . Migraine syndrome 03/01/2012  . Presence of (intrauterine) contraceptive device 03/01/2012  . Urinary tract infection 03/01/2012    Current Outpatient Medications on File Prior to Visit  Medication Sig Dispense Refill  . acyclovir ointment (ZOVIRAX) 5 % Apply to area three times a day for 7 days when has rash    . aspirin EC 325 MG tablet Take 325 mg by mouth daily as needed (chest pain).    . folic acid (FOLVITE) 1 MG tablet     . ibuprofen (ADVIL,MOTRIN) 200 MG tablet Take 200 mg by mouth every 6 (six) hours as needed for pain.    . methotrexate (RHEUMATREX) 2.5 MG tablet     . naproxen  (NAPROSYN) 500 MG tablet     . nitrofurantoin, macrocrystal-monohydrate, (MACROBID) 100 MG capsule Take one pill after intercourse as needed    . valACYclovir (VALTREX) 500 MG tablet Take by mouth.     No current facility-administered medications on file prior to visit.     No Known Allergies   History reviewed. No pertinent family history.  Social History   Socioeconomic History  . Marital status: Single    Spouse name: Not on file  . Number of children: Not on file  . Years of education: Not on file  . Highest education level: Not on file  Occupational History  . Not on file  Social Needs  . Financial resource strain: Not on file  . Food insecurity    Worry: Not on file    Inability: Not on file  . Transportation needs    Medical: Not on file    Non-medical: Not on file  Tobacco Use  . Smoking status: Never Smoker  . Smokeless tobacco: Never Used  Substance and Sexual Activity  . Alcohol use: Yes    Comment: rarely  . Drug use: No  . Sexual activity: Yes    Birth control/protection: Surgical, I.U.D.  Lifestyle  . Physical activity    Days per week: Not on file    Minutes per session: Not on file  . Stress: Not on file  Relationships  . Social Herbalist on phone: Not on file    Gets together: Not on file    Attends religious service: Not on file    Active member of club or organization: Not on file    Attends meetings of clubs or organizations: Not on file    Relationship status: Not on file  Other Topics Concern  . Not on file  Social History Narrative  . Not on file    Past Surgical History:  Procedure Laterality Date  . CESAREAN SECTION    . CESAREAN SECTION  '99, '00 ,'02   x 3  . TUBAL LIGATION      Objective:  General: Alert and oriented x3 in no acute distress  Dermatology: Small hyperkeratotic lesion overlying fifth toes at PIPJ dorsally bilateral. No open lesions bilateral lower extremities, no webspace macerations, no  ecchymosis bilateral, all nails x 10 are well manicured.  Vascular: Dorsalis Pedis and Posterior Tibial pedal pulses 2/4, Capillary Fill Time 3 seconds,(+) pedal hair growth bilateral, no edema bilateral lower extremities, Temperature gradient within normal limits.  Neurology: Johney Maine sensation intact via light touch bilateral.  Musculoskeletal: Semi-flexible hammertoes bilateral fifth toes with Mild tenderness with palpation at PIPJ Right>Left. Ankle, Subtalar, Midtarsal, and MTPJ joint range of motion is within normal limits, there is no 1st ray hypermobility noted bilateral, No bunion deformity noted bilateral. No pain with calf compression bilateral.  Strength within normal limits in all groups bilateral.   Gait: Unassisted, Non-antalgic.  Xrays from previous visit reviewed Right/Left Foot    Impression: Varus rotated fifth toes consistent with hammertoe deformity bilateral.       Assessment and Plan: Problem List Items Addressed This Visit    None    Visit Diagnoses    Hammertoes of both feet    -  Primary   Pain in left toe(s)       Pain in right toe(s)       Corns           -Complete examination performed -Xrays reviewed -Discussed treatement options for varus rotated hammertoe deformities bilateral --Patient opt for surgical management. Consent obtained for excision of corn with hammertoe repair bilateral fifth toes. Pre and Post op course explained. Risks, benefits, alternatives explained. No guarantees given or implied. Surgical booking slip submitted and provided patient with Surgical packet and info for [facility] and [facility].  Patient will check with each facility to see if there is any difference in cost and will let us know which facility she prefers to have her surgery done it.  We provided patient with history and physical form and advised her if she decides to have surgery at [facility] day surgery center she will need to have an H&P performed prior.  Patient is  aware that she will need clearance from her rheumatologist on how to manage her methotrexate perioperatively. -Will dispense bilateral postop shoes at surgical center -All questions answered -Patient to return to office after surgery or sooner if condition worsens.  Landis Martins, DPM

## 2019-05-22 NOTE — Patient Instructions (Signed)
Pre-Operative Instructions  Congratulations, you have decided to take an important step towards improving your quality of life.  You can be assured that the doctors and staff at [facility] will be with you every step of the way.  Here are some important things you should know:  1. Plan to be at the surgery center/hospital at least 1 (one) hour prior to your scheduled time, unless otherwise directed by the surgical center/hospital staff.  You must have a responsible adult accompany you, remain during the surgery and drive you home.  Make sure you have directions to the surgical center/hospital to ensure you arrive on time. 2. If you are having surgery at [facility] or [facility], you will need a copy of your medical history and physical form from your family physician within one month prior to the date of surgery. We will give you a form for your primary physician to complete.  3. We make every effort to accommodate the date you request for surgery.  However, there are times where surgery dates or times have to be moved.  We will contact you as soon as possible if a change in schedule is required.   4. No aspirin/ibuprofen for one week before surgery.  If you are on aspirin, any non-steroidal anti-inflammatory medications (Mobic, Aleve, Ibuprofen) should not be taken seven (7) days prior to your surgery.  You make take Tylenol for pain prior to surgery.  5. Medications - If you are taking daily heart and blood pressure medications, seizure, reflux, allergy, asthma, anxiety, pain or diabetes medications, make sure you notify the surgery center/hospital before the day of surgery so they can tell you which medications you should take or avoid the day of surgery. 6. No food or drink after midnight the night before surgery unless directed otherwise by surgical center/hospital staff. 7. No alcoholic beverages 24-hours prior to surgery.  No smoking 24-hours prior or 24-hours after  surgery. 8. Wear loose pants or shorts. They should be loose enough to fit over bandages, boots, and casts. 9. Don't wear slip-on shoes. Sneakers are preferred. 10. Bring your boot with you to the surgery center/hospital.  Also bring crutches or a walker if your physician has prescribed it for you.  If you do not have this equipment, it will be provided for you after surgery. 11. If you have not been contacted by the surgery center/hospital by the day before your surgery, call to confirm the date and time of your surgery. 12. Leave-time from work may vary depending on the type of surgery you have.  Appropriate arrangements should be made prior to surgery with your employer. 13. Prescriptions will be provided immediately following surgery by your doctor.  Fill these as soon as possible after surgery and take the medication as directed. Pain medications will not be refilled on weekends and must be approved by the doctor. 14. Remove nail polish on the operative foot and avoid getting pedicures prior to surgery. 15. Wash the night before surgery.  The night before surgery wash the foot and leg well with water and the antibacterial soap provided. Be sure to pay special attention to beneath the toenails and in between the toes.  Wash for at least three (3) minutes. Rinse thoroughly with water and dry well with a towel.  Perform this wash unless told not to do so by your physician.  Enclosed: 1 Ice pack (please put in freezer the night before surgery)   1 Hibiclens skin cleaner     Pre-op instructions  If you have any questions regarding the instructions, please do not hesitate to call our office.  [place]: [address], [place], [place] [address] -- [phone]  [place]: [address] [place], [place] [address] -- [phone]  [place]: [address]  [place], [place] [address] -- [phone]  [place]: [address], [address], [place], [place] [address] -- [phone]  Website:  [email or web address] 

## 2019-05-23 ENCOUNTER — Telehealth: Payer: Self-pay | Admitting: *Deleted

## 2019-05-23 NOTE — Telephone Encounter (Signed)
"  I saw Dr. Cannon Kettle yesterday.  She recommended surgery.  I wanted to compare prices between [facility] and [facility].  I think I'm going to go with Mallory Acevedo.  Can you please let Dr. Cannon Kettle know?"  Yes, I'll let her know.

## 2019-05-31 ENCOUNTER — Encounter: Payer: Self-pay | Admitting: *Deleted

## 2019-05-31 ENCOUNTER — Telehealth: Payer: Self-pay | Admitting: *Deleted

## 2019-05-31 NOTE — Telephone Encounter (Signed)
-----   Message from Landis Martins, [place] sent at 05/22/2019  4:16 PM EDT ----- Regarding: Clearance from Rheumatologist Patient will need medical clearance from her rheumatologist on how to manage her metatherexate and meds peri-op Thanks Dr. Cannon Kettle

## 2019-05-31 NOTE — Telephone Encounter (Signed)
I left the patient a message to call me back on tomorrow.  I need to know the name of her Rheumatologist.

## 2019-06-12 ENCOUNTER — Encounter: Payer: Self-pay | Admitting: *Deleted

## 2019-06-12 NOTE — Telephone Encounter (Signed)
I attempted to call the patient again to get the name of her Rheumatologist.  I left her a message to call me back.

## 2019-06-12 NOTE — Progress Notes (Signed)
Per Dr. Cannon Kettle, I sent a surgical medical clearance request letter to Dr. Kathlene November.  Mallory Acevedo is scheduled for surgery on July 16, 2019.

## 2019-06-12 NOTE — Telephone Encounter (Signed)
"  I'm returning your call."  I was calling to get the name of your Rheumatologist.  Dr. Cannon Kettle wants to get medical clearance prior to you having surgery.  "His name is Dr. Kathlene November.  His first name starts with a G.  I actually just saw him today."   I faxed a medical clearance letter to Dr. Kathlene November.

## 2019-06-28 ENCOUNTER — Telehealth: Payer: Self-pay | Admitting: Sports Medicine

## 2019-06-28 NOTE — Telephone Encounter (Signed)
I'm calling in regards to my surgery scheduled on 07/16/2019. I have a question in regard to the COVID testing that needed to be done, is there a certain date that I need to have that done by.

## 2019-07-10 ENCOUNTER — Telehealth: Payer: Self-pay | Admitting: *Deleted

## 2019-07-10 NOTE — Telephone Encounter (Signed)
DOS 07/16/2019 HAMMER TOE REPAIR 5TH B/L - 40981; EXCISION BENIGN LESION 1.0 CM 5TH B/L - 11421  MEDCOST PREFERRED: Effective Date - 07/23/2014   (Spoke to [place] for benefits/eligibility) Deductible - $2000, met $276.80 Co-insurance - 80% Out of pocket - $5000, met $750.09   Per Vaughan Basta B Pre-certification is NOT REQUIRED. Call Reference # is [id number].

## 2019-07-16 ENCOUNTER — Encounter: Payer: Self-pay | Admitting: Sports Medicine

## 2019-07-16 ENCOUNTER — Other Ambulatory Visit: Payer: Self-pay | Admitting: Sports Medicine

## 2019-07-16 DIAGNOSIS — R110 Nausea: Secondary | ICD-10-CM

## 2019-07-16 DIAGNOSIS — M2042 Other hammer toe(s) (acquired), left foot: Secondary | ICD-10-CM

## 2019-07-16 DIAGNOSIS — M2041 Other hammer toe(s) (acquired), right foot: Secondary | ICD-10-CM | POA: Diagnosis not present

## 2019-07-16 DIAGNOSIS — G8918 Other acute postprocedural pain: Secondary | ICD-10-CM

## 2019-07-16 DIAGNOSIS — M79674 Pain in right toe(s): Secondary | ICD-10-CM

## 2019-07-16 DIAGNOSIS — T402X5A Adverse effect of other opioids, initial encounter: Secondary | ICD-10-CM

## 2019-07-16 DIAGNOSIS — L84 Corns and callosities: Secondary | ICD-10-CM

## 2019-07-16 DIAGNOSIS — K5903 Drug induced constipation: Secondary | ICD-10-CM

## 2019-07-16 DIAGNOSIS — D2371 Other benign neoplasm of skin of right lower limb, including hip: Secondary | ICD-10-CM | POA: Diagnosis not present

## 2019-07-16 DIAGNOSIS — D2372 Other benign neoplasm of skin of left lower limb, including hip: Secondary | ICD-10-CM | POA: Diagnosis not present

## 2019-07-16 MED ORDER — IBUPROFEN 800 MG PO TABS: 800.0000 mg | ORAL_TABLET | Freq: Three times a day (TID) | ORAL | 0 refills | Status: DC | PRN

## 2019-07-16 MED ORDER — HYDROCODONE-ACETAMINOPHEN 10-325 MG PO TABS: 1.0000 | ORAL_TABLET | Freq: Four times a day (QID) | ORAL | 0 refills | Status: AC | PRN

## 2019-07-16 MED ORDER — DOCUSATE SODIUM 100 MG PO CAPS: 100.0000 mg | ORAL_CAPSULE | Freq: Two times a day (BID) | ORAL | 0 refills | Status: DC

## 2019-07-16 MED ORDER — PROMETHAZINE HCL 25 MG PO TABS: 25.0000 mg | ORAL_TABLET | Freq: Three times a day (TID) | ORAL | 0 refills | Status: DC | PRN

## 2019-07-16 NOTE — Progress Notes (Signed)
Post op meds entered -Dr. S 

## 2019-07-16 NOTE — Progress Notes (Signed)
Bil 5th hammertoe repair with excision of corns [facility] with Dr. Cannon Kettle

## 2019-07-17 ENCOUNTER — Other Ambulatory Visit: Payer: Self-pay

## 2019-07-17 ENCOUNTER — Telehealth: Payer: Self-pay | Admitting: Sports Medicine

## 2019-07-17 ENCOUNTER — Ambulatory Visit: Payer: PRIVATE HEALTH INSURANCE

## 2019-07-17 DIAGNOSIS — M2041 Other hammer toe(s) (acquired), right foot: Secondary | ICD-10-CM

## 2019-07-17 NOTE — Telephone Encounter (Signed)
Post op phone call made to patient. Patient did not answer. Left SMS notification for patient to call back if she has any post op ?s or concerns. -Dr. Cannon Kettle

## 2019-07-24 ENCOUNTER — Other Ambulatory Visit: Payer: Self-pay

## 2019-07-24 ENCOUNTER — Ambulatory Visit (INDEPENDENT_AMBULATORY_CARE_PROVIDER_SITE_OTHER): Payer: PRIVATE HEALTH INSURANCE | Admitting: Podiatry

## 2019-07-24 ENCOUNTER — Ambulatory Visit (INDEPENDENT_AMBULATORY_CARE_PROVIDER_SITE_OTHER): Payer: PRIVATE HEALTH INSURANCE

## 2019-07-24 VITALS — Temp 98.1°F

## 2019-07-24 DIAGNOSIS — M2042 Other hammer toe(s) (acquired), left foot: Secondary | ICD-10-CM | POA: Diagnosis not present

## 2019-07-24 DIAGNOSIS — Z09 Encounter for follow-up examination after completed treatment for conditions other than malignant neoplasm: Secondary | ICD-10-CM

## 2019-07-24 DIAGNOSIS — M2041 Other hammer toe(s) (acquired), right foot: Secondary | ICD-10-CM

## 2019-07-24 DIAGNOSIS — G8918 Other acute postprocedural pain: Secondary | ICD-10-CM

## 2019-07-24 MED ORDER — OXYCODONE-ACETAMINOPHEN 5-325 MG PO TABS: 1.0000 | ORAL_TABLET | Freq: Four times a day (QID) | ORAL | 0 refills | Status: DC | PRN

## 2019-07-31 ENCOUNTER — Ambulatory Visit (INDEPENDENT_AMBULATORY_CARE_PROVIDER_SITE_OTHER): Payer: Self-pay | Admitting: Sports Medicine

## 2019-07-31 ENCOUNTER — Encounter: Payer: Self-pay | Admitting: Sports Medicine

## 2019-07-31 ENCOUNTER — Other Ambulatory Visit: Payer: Self-pay

## 2019-07-31 DIAGNOSIS — M79675 Pain in left toe(s): Secondary | ICD-10-CM

## 2019-07-31 DIAGNOSIS — L84 Corns and callosities: Secondary | ICD-10-CM

## 2019-07-31 DIAGNOSIS — M2041 Other hammer toe(s) (acquired), right foot: Secondary | ICD-10-CM

## 2019-07-31 DIAGNOSIS — M79674 Pain in right toe(s): Secondary | ICD-10-CM

## 2019-07-31 DIAGNOSIS — M2042 Other hammer toe(s) (acquired), left foot: Secondary | ICD-10-CM

## 2019-07-31 DIAGNOSIS — G8918 Other acute postprocedural pain: Secondary | ICD-10-CM

## 2019-07-31 DIAGNOSIS — Z09 Encounter for follow-up examination after completed treatment for conditions other than malignant neoplasm: Secondary | ICD-10-CM

## 2019-07-31 NOTE — Progress Notes (Signed)
Subjective: Mallory Acevedo is a 38 y.o. female patient seen today in office for POV #2  (DOS 07-16-19), S/P Bilateral 5th hammertoe repair with excision of corn. Patient denies pain at surgical sites right now percocet helps with pain at night, denies calf pain, denies headache, chest pain, shortness of breath, nausea, vomiting, fever, or chills. Patient states that s/he is doing well and is only taking Ibuprofen during the day and percocet at night as needed. No other issues noted.   Patient Active Problem List   Diagnosis Date Noted  . Joint pain in fingers of both hands 12/01/2018  . Muscle cramps 12/01/2018  . Lumbar radiculopathy 12/20/2017  . Left hip pain 12/13/2017  . Left elbow pain 12/01/2017  . 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 Vitamin D deficiency 04/30/2013  . Adenomyoma of uterus 03/01/2012  . Asthma, well controlled 03/01/2012  . History of pregnancy 03/01/2012  . Migraine syndrome 03/01/2012  . Presence of (intrauterine) contraceptive device 03/01/2012  . Urinary tract infection 03/01/2012    Current Outpatient Medications on File Prior to Visit  Medication Sig Dispense Refill  . acyclovir ointment (ZOVIRAX) 5 % Apply to area three times a day for 7 days when has rash    . aspirin EC 325 MG tablet Take 325 mg by mouth daily as needed (chest pain).    Marland Kitchen docusate sodium (COLACE) 100 MG capsule Take 1 capsule (100 mg total) by mouth 2 (two) times daily. 10 capsule 0  . folic acid (FOLVITE) 1 MG tablet     . ibuprofen (ADVIL) 800 MG tablet Take 1 tablet (800 mg total) by mouth every 8 (eight) hours as needed. 30 tablet 0  . methotrexate (RHEUMATREX) 2.5 MG tablet     . naproxen (NAPROSYN) 500 MG tablet     . nitrofurantoin, macrocrystal-monohydrate, (MACROBID) 100 MG capsule Take one pill after intercourse as needed    . oxyCODONE-acetaminophen (PERCOCET/ROXICET) 5-325 MG tablet Take 1-2 tablets by mouth every 6 (six) hours as needed for severe pain.  20 tablet 0  . promethazine (PHENERGAN) 25 MG tablet Take 1 tablet (25 mg total) by mouth every 8 (eight) hours as needed for nausea or vomiting. 20 tablet 0  . valACYclovir (VALTREX) 500 MG tablet Take by mouth.     No current facility-administered medications on file prior to visit.     No Known Allergies  Objective: There were no vitals filed for this visit.  General: No acute distress, AAOx3  Right/Left foot: Sutures intact with no gapping or dehiscence at surgical site, mild swelling to 5th toes right/left foot, no erythema, no warmth, no drainage, no signs of infection noted, Capillary fill time <3 seconds in all digits, gross sensation present via light touch to right/left foot. No pain or crepitation with range of motion right/left foot.  No pain with calf compression.   Assessment and Plan:  Problem List Items Addressed This Visit    None    Visit Diagnoses    Hammertoes of both feet    -  Primary   Surgery follow-up       Corns       Toe pain, bilateral       Post-op pain           -Patient seen and evaluated -Sutures removed  -Advised patient to continue with post-op shoe on left and right foot until can tolerate a normal shoe   -Advised patient to limit activity to necessity  -  Advised patient to ice and elevate as necessary  -Will plan for at next office visit xray and discuss return to work. In the meantime, patient to call office if any issues or problems arise.   Landis Martins, DPM

## 2019-08-01 NOTE — Progress Notes (Signed)
Subjective: Mallory Acevedo is a 38 y.o. is seen today in office s/p bilateral fifth digit arthroplasties preformed by Dr. Cannon Kettle.  She states that she still having quite a bit of pain and she cannot manage the pain at times.  She has been on Vicodin for pain. Denies any systemic complaints such as fevers, chills, nausea, vomiting. No calf pain, chest pain, shortness of breath.   Objective: General: No acute distress, AAOx3  DP/PT pulses palpable 2/4, CRT < 3 sec to all digits.  Protective sensation intact. Motor function intact.  Bilateral feet: Incisions are well coapted without any evidence of dehiscence. There is no surrounding erythema, ascending cellulitis, fluctuance, crepitus, malodor, drainage/purulence. There is mild edema around the surgical site. There is moderate pain along the surgical site.  Toes are in rectus position. No other areas of tenderness to bilateral lower extremities.  No other open lesions or pre-ulcerative lesions.  No pain with calf compression, swelling, warmth, erythema.   Assessment and Plan:  Status post bilateral fifth digit PIPJ arthroplasty, doing well with no complications other than postoperative pain  -Treatment options discussed including all alternatives, risks, and complications -X-rays were obtained and reviewed.  Status post PIPJ arthroplasty.  No evidence of acute fracture. -Incision appears to be healing well.  Antibiotic ointment and a bandage was applied.  Keep dressing clean, dry, intact. -Ice/elevation -Pain medication as needed-changed to Percocet -Monitor for any clinical signs or symptoms of infection and DVT/PE and directed to call the office immediately should any occur or go to the ER. -Follow-up as scheduled with Dr. Cannon Kettle or sooner if any problems arise. In the meantime, encouraged to call the office with any questions, concerns, change in symptoms.   Celesta Gentile, DPM

## 2019-08-14 ENCOUNTER — Ambulatory Visit: Payer: Self-pay | Admitting: Podiatry

## 2019-08-14 ENCOUNTER — Encounter: Payer: Self-pay | Admitting: Podiatry

## 2019-08-14 ENCOUNTER — Other Ambulatory Visit: Payer: Self-pay

## 2019-08-14 ENCOUNTER — Other Ambulatory Visit: Payer: Self-pay | Admitting: Podiatry

## 2019-08-14 ENCOUNTER — Encounter: Payer: Self-pay | Admitting: Sports Medicine

## 2019-08-14 ENCOUNTER — Ambulatory Visit (INDEPENDENT_AMBULATORY_CARE_PROVIDER_SITE_OTHER): Payer: PRIVATE HEALTH INSURANCE

## 2019-08-14 DIAGNOSIS — M2041 Other hammer toe(s) (acquired), right foot: Secondary | ICD-10-CM

## 2019-08-14 DIAGNOSIS — M2042 Other hammer toe(s) (acquired), left foot: Secondary | ICD-10-CM

## 2019-08-14 DIAGNOSIS — Z09 Encounter for follow-up examination after completed treatment for conditions other than malignant neoplasm: Secondary | ICD-10-CM

## 2019-08-14 DIAGNOSIS — M79671 Pain in right foot: Secondary | ICD-10-CM

## 2019-08-14 NOTE — Progress Notes (Signed)
  Subjective:  Patient ID: Mallory Acevedo, female    DOB: June 07, 1981,  MRN: [id number]  Chief Complaint  Patient presents with  . Routine Post Op    Pt states she is healing well with no concerns. Pt will be discussing whether or not she can return to work.    DOS: 07/16/19  Procedure: B/L hammertoe surgery of 5th digit with excision of corn  38 y.o. female returns for post-op check. Patient is doing well. She denies any pain to the surgical site. No clinical signs of infection noted. Ambulating in regular shoes (crocs)  Review of Systems: Negative except as noted in the HPI. Denies N/V/F/Ch.  Past Medical History:  Diagnosis Date  . Asthma   . HSV-2 (herpes simplex virus 2) infection   . Migraines     Current Outpatient Medications:  .  acyclovir ointment (ZOVIRAX) 5 %, Apply to area three times a day for 7 days when has rash, Disp: , Rfl:  .  Adalimumab 40 MG/0.4ML PNKT, Inject into the skin., Disp: , Rfl:  .  aspirin EC 325 MG tablet, Take 325 mg by mouth daily as needed (chest pain)., Disp: , Rfl:  .  docusate sodium (COLACE) 100 MG capsule, Take 1 capsule (100 mg total) by mouth 2 (two) times daily., Disp: 10 capsule, Rfl: 0 .  folic acid (FOLVITE) 1 MG tablet, , Disp: , Rfl:  .  ibuprofen (ADVIL) 800 MG tablet, Take 1 tablet (800 mg total) by mouth every 8 (eight) hours as needed., Disp: 30 tablet, Rfl: 0 .  methotrexate (RHEUMATREX) 2.5 MG tablet, , Disp: , Rfl:  .  naproxen (NAPROSYN) 500 MG tablet, , Disp: , Rfl:  .  nitrofurantoin, macrocrystal-monohydrate, (MACROBID) 100 MG capsule, Take one pill after intercourse as needed, Disp: , Rfl:  .  oxyCODONE-acetaminophen (PERCOCET/ROXICET) 5-325 MG tablet, Take 1-2 tablets by mouth every 6 (six) hours as needed for severe pain., Disp: 20 tablet, Rfl: 0 .  promethazine (PHENERGAN) 25 MG tablet, Take 1 tablet (25 mg total) by mouth every 8 (eight) hours as needed for nausea or vomiting., Disp: 20 tablet, Rfl: 0 .   valACYclovir (VALTREX) 500 MG tablet, Take by mouth., Disp: , Rfl:   Social History   Tobacco Use  Smoking Status Never Smoker  Smokeless Tobacco Never Used    No Known Allergies Objective:  There were no vitals filed for this visit. There is no height or weight on file to calculate BMI. Constitutional Well developed. Well nourished.  Vascular Foot warm and well perfused. Capillary refill normal to all digits.   Neurologic Normal speech. Oriented to person, place, and time. Epicritic sensation to light touch grossly present bilaterally.  Dermatologic Skin healing well without signs of infection. Skin edges well coapted without signs of infection.  Orthopedic: Tenderness to palpation noted about the surgical site.   Radiographs: Two views of skeletally mature adult noted AP/Lateral Bilaterally. Sharp surgical margins noted at the fifth digit bilaterally. No soft tissue gas noted.   Assessment:   1. Hammertoes of both feet   2. Surgery follow-up   3. Pain in right foot    Plan:  Patient was evaluated and treated and all questions answered.  S/p foot surgery bilaterally 5th digit arthroplasty with excision of the corn -Progressing as expected post-operatively. -XR: were reviewed. See description above -WB Status: WBAT in regular shoes -Sutures: None. -Medications: Ibuprofen   Return in about 1 month (around 09/13/2019).

## 2019-08-17 NOTE — Progress Notes (Signed)
Patient is here today for postoperative dressing changes, she states that her feet got wet in the shower.  Removed wet bandages, reapplied dry sterile bandages with compression.  Advised her to keep her current postop appointment.

## 2019-09-11 ENCOUNTER — Ambulatory Visit (INDEPENDENT_AMBULATORY_CARE_PROVIDER_SITE_OTHER): Payer: PRIVATE HEALTH INSURANCE | Admitting: Sports Medicine

## 2019-09-11 ENCOUNTER — Other Ambulatory Visit: Payer: Self-pay

## 2019-09-11 ENCOUNTER — Encounter: Payer: Self-pay | Admitting: Sports Medicine

## 2019-09-11 DIAGNOSIS — Z09 Encounter for follow-up examination after completed treatment for conditions other than malignant neoplasm: Secondary | ICD-10-CM

## 2019-09-11 DIAGNOSIS — M79675 Pain in left toe(s): Secondary | ICD-10-CM

## 2019-09-11 DIAGNOSIS — M79674 Pain in right toe(s): Secondary | ICD-10-CM

## 2019-09-11 DIAGNOSIS — M2041 Other hammer toe(s) (acquired), right foot: Secondary | ICD-10-CM

## 2019-09-11 DIAGNOSIS — M2042 Other hammer toe(s) (acquired), left foot: Secondary | ICD-10-CM

## 2019-09-11 NOTE — Progress Notes (Signed)
Subjective: Mallory Acevedo is a 38 y.o. female patient seen today in office for POV #4  (DOS 07-16-19), S/P Bilateral 5th hammertoe repair with excision of corn. Patient denies pain at surgical sites except for a little bit of tenderness sometimes when she tries to wear a normal tennis shoe feels best in crocs especially when her toes are a little swollen but otherwise is doing good and is back at work without any major pain or limitations. No other issues noted.   Patient Active Problem List   Diagnosis Date Noted  . Joint pain in fingers of both hands 12/01/2018  . Muscle cramps 12/01/2018  . Lumbar radiculopathy 12/20/2017  . Left hip pain 12/13/2017  . Left elbow pain 12/01/2017  . 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 Vitamin D deficiency 04/30/2013  . Adenomyoma of uterus 03/01/2012  . Asthma, well controlled 03/01/2012  . History of pregnancy 03/01/2012  . Migraine syndrome 03/01/2012  . Presence of (intrauterine) contraceptive device 03/01/2012  . Urinary tract infection 03/01/2012    Current Outpatient Medications on File Prior to Visit  Medication Sig Dispense Refill  . acyclovir ointment (ZOVIRAX) 5 % Apply to area three times a day for 7 days when has rash    . Adalimumab 40 MG/0.4ML PNKT Inject into the skin.    . folic acid (FOLVITE) 1 MG tablet     . methotrexate (RHEUMATREX) 2.5 MG tablet     . nitrofurantoin, macrocrystal-monohydrate, (MACROBID) 100 MG capsule Take one pill after intercourse as needed     No current facility-administered medications on file prior to visit.     No Known Allergies  Objective: There were no vitals filed for this visit.  General: No acute distress, AAOx3  Right/Left foot: Surgical site is well-healed 5th toes right/left foot, minimal swelling, no erythema, no warmth, no drainage, no signs of infection noted, Capillary fill time <3 seconds in all digits, gross sensation present via light touch to right/left foot. No  pain or crepitation with range of motion right/left foot.  No pain with calf compression.   Assessment and Plan:  Problem List Items Addressed This Visit    None    Visit Diagnoses    Hammertoes of both feet    -  Primary   Surgery follow-up       Toe pain, bilateral           -Patient seen and evaluated -Surgical sites healing well advised to use to patient's comfort and activities to patient's comfort -Continue with work with no restrictions -Return to office in 4 weeks for final postop check and x-rays  Landis Martins, DPM

## 2019-10-09 ENCOUNTER — Ambulatory Visit (INDEPENDENT_AMBULATORY_CARE_PROVIDER_SITE_OTHER): Payer: PRIVATE HEALTH INSURANCE

## 2019-10-09 ENCOUNTER — Ambulatory Visit (INDEPENDENT_AMBULATORY_CARE_PROVIDER_SITE_OTHER): Payer: PRIVATE HEALTH INSURANCE | Admitting: Sports Medicine

## 2019-10-09 ENCOUNTER — Encounter: Payer: Self-pay | Admitting: Sports Medicine

## 2019-10-09 ENCOUNTER — Other Ambulatory Visit: Payer: Self-pay

## 2019-10-09 DIAGNOSIS — M79672 Pain in left foot: Secondary | ICD-10-CM

## 2019-10-09 DIAGNOSIS — M2042 Other hammer toe(s) (acquired), left foot: Secondary | ICD-10-CM | POA: Diagnosis not present

## 2019-10-09 DIAGNOSIS — M2041 Other hammer toe(s) (acquired), right foot: Secondary | ICD-10-CM

## 2019-10-09 DIAGNOSIS — Z09 Encounter for follow-up examination after completed treatment for conditions other than malignant neoplasm: Secondary | ICD-10-CM

## 2019-10-09 DIAGNOSIS — M79671 Pain in right foot: Secondary | ICD-10-CM

## 2019-10-09 NOTE — Progress Notes (Signed)
Subjective: Mallory Acevedo is a 38 y.o. female patient seen today in office for POV #5 (DOS 07-16-19), S/P Bilateral 5th hammertoe repair with excision of corn. Patient denies pain at surgical sites in normal shoes and normal activities with no problems. No other issues noted.   Patient Active Problem List   Diagnosis Date Noted  . Joint pain in fingers of both hands 12/01/2018  . Muscle cramps 12/01/2018  . Lumbar radiculopathy 12/20/2017  . Left hip pain 12/13/2017  . Left elbow pain 12/01/2017  . 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 Vitamin D deficiency 04/30/2013  . Adenomyoma of uterus 03/01/2012  . Asthma, well controlled 03/01/2012  . History of pregnancy 03/01/2012  . Migraine syndrome 03/01/2012  . Presence of (intrauterine) contraceptive device 03/01/2012  . Urinary tract infection 03/01/2012    Current Outpatient Medications on File Prior to Visit  Medication Sig Dispense Refill  . acyclovir ointment (ZOVIRAX) 5 % Apply to area three times a day for 7 days when has rash    . Adalimumab 40 MG/0.4ML PNKT Inject into the skin.    . folic acid (FOLVITE) 1 MG tablet     . methotrexate (RHEUMATREX) 2.5 MG tablet     . nitrofurantoin, macrocrystal-monohydrate, (MACROBID) 100 MG capsule Take one pill after intercourse as needed     No current facility-administered medications on file prior to visit.     No Known Allergies  Objective: There were no vitals filed for this visit.  General: No acute distress, AAOx3  Right/Left foot: Surgical sites are well-healed 5th toes right/left foot, minimal swelling, no erythema, no warmth, no drainage, no signs of infection noted, Capillary fill time <3 seconds in all digits, gross sensation present via light touch to right/left foot. No pain or crepitation with range of motion right/left foot.  No pain with calf compression.   Assessment and Plan:  Problem List Items Addressed This Visit    None    Visit Diagnoses     Bilateral foot pain    -  Primary   Relevant Orders   DG Foot Complete Left   DG Foot Complete Right   Hammertoes of both feet       Surgery follow-up          -Patient seen and evaluated -Xrays reviewed consistent with appropriate  -Surgical sites well healed -Continue with normal activities and shoes as tolerated  -Return to office as needed or sooner if problems or issues arise.   Landis Martins, DPM

## 2019-10-12 ENCOUNTER — Other Ambulatory Visit: Payer: Self-pay | Admitting: Sports Medicine

## 2019-10-12 DIAGNOSIS — M2042 Other hammer toe(s) (acquired), left foot: Secondary | ICD-10-CM

## 2019-10-12 DIAGNOSIS — M2041 Other hammer toe(s) (acquired), right foot: Secondary | ICD-10-CM

## 2020-02-07 ENCOUNTER — Ambulatory Visit: Payer: PRIVATE HEALTH INSURANCE | Attending: Internal Medicine

## 2020-02-07 DIAGNOSIS — Z23 Encounter for immunization: Secondary | ICD-10-CM

## 2020-02-07 NOTE — Progress Notes (Signed)
   Covid-19 Vaccination Clinic  Name:  Mallory Acevedo    MRN: [id number] DOB: 08-12-1981  02/07/2020  [name] was observed post Covid-19 immunization for 15 minutes without incident. She was provided with Vaccine Information Sheet and instruction to access the V-Safe system.   [name] was instructed to call 911 with any severe reactions post vaccine: Marland Kitchen Difficulty breathing  . Swelling of face and throat  . A fast heartbeat  . A bad rash all over body  . Dizziness and weakness   Immunizations Administered    Name Date Dose VIS Date Route   Moderna COVID-19 Vaccine 02/07/2020 12:18 PM 0.5 mL 10/23/2019 Intramuscular   Manufacturer: Moderna   Lot: [id number]   [id number]

## 2020-02-21 ENCOUNTER — Ambulatory Visit
Admission: RE | Admit: 2020-02-21 | Discharge: 2020-02-21 | Disposition: A | Discharge status: Home / Self Care | Payer: Managed Care, Other (non HMO) | Source: Ambulatory Visit | Attending: Obstetrics & Gynecology | Admitting: Obstetrics & Gynecology

## 2020-02-21 ENCOUNTER — Other Ambulatory Visit: Payer: Self-pay

## 2020-02-21 ENCOUNTER — Other Ambulatory Visit: Payer: Self-pay | Admitting: Obstetrics & Gynecology

## 2020-02-21 ENCOUNTER — Ambulatory Visit
Admission: RE | Admit: 2020-02-21 | Discharge: 2020-02-21 | Disposition: A | Discharge status: Home / Self Care | Payer: PRIVATE HEALTH INSURANCE | Source: Ambulatory Visit | Attending: Obstetrics & Gynecology | Admitting: Obstetrics & Gynecology

## 2020-02-21 DIAGNOSIS — N6489 Other specified disorders of breast: Secondary | ICD-10-CM

## 2020-02-21 DIAGNOSIS — Z1231 Encounter for screening mammogram for malignant neoplasm of breast: Secondary | ICD-10-CM

## 2020-03-04 ENCOUNTER — Ambulatory Visit: Payer: PRIVATE HEALTH INSURANCE | Attending: Family

## 2020-03-04 DIAGNOSIS — Z23 Encounter for immunization: Secondary | ICD-10-CM

## 2020-03-04 NOTE — Progress Notes (Signed)
   Covid-19 Vaccination Clinic  Name:  Mallory Acevedo    MRN: [id number] DOB: 1981-07-11  03/04/2020  [name] was observed post Covid-19 immunization for 15 minutes without incident. She was provided with Vaccine Information Sheet and instruction to access the V-Safe system.   [name] was instructed to call 911 with any severe reactions post vaccine: Marland Kitchen Difficulty breathing  . Swelling of face and throat  . A fast heartbeat  . A bad rash all over body  . Dizziness and weakness   Immunizations Administered    Name Date Dose VIS Date Route   Moderna COVID-19 Vaccine 03/04/2020 11:49 AM 0.5 mL 10/23/2019 Intramuscular   Manufacturer: Moderna   Lot: [id number]   [id number]

## 2020-03-11 ENCOUNTER — Ambulatory Visit: Payer: PRIVATE HEALTH INSURANCE

## 2020-10-27 ENCOUNTER — Other Ambulatory Visit: Payer: Self-pay | Admitting: Family Medicine

## 2020-10-27 DIAGNOSIS — R29898 Other symptoms and signs involving the musculoskeletal system: Secondary | ICD-10-CM

## 2020-10-27 DIAGNOSIS — R292 Abnormal reflex: Secondary | ICD-10-CM

## 2020-10-27 DIAGNOSIS — R519 Headache, unspecified: Secondary | ICD-10-CM

## 2020-10-28 ENCOUNTER — Ambulatory Visit
Admission: RE | Admit: 2020-10-28 | Discharge: 2020-10-28 | Disposition: A | Discharge status: Home / Self Care | Payer: Managed Care, Other (non HMO) | Source: Ambulatory Visit | Attending: Family Medicine | Admitting: Family Medicine

## 2020-10-28 ENCOUNTER — Other Ambulatory Visit: Payer: Self-pay | Admitting: Family Medicine

## 2020-10-28 ENCOUNTER — Other Ambulatory Visit: Payer: Self-pay

## 2020-10-28 DIAGNOSIS — R29898 Other symptoms and signs involving the musculoskeletal system: Secondary | ICD-10-CM

## 2020-10-28 DIAGNOSIS — R519 Headache, unspecified: Secondary | ICD-10-CM

## 2020-10-28 DIAGNOSIS — G44201 Tension-type headache, unspecified, intractable: Secondary | ICD-10-CM

## 2020-10-28 DIAGNOSIS — D849 Immunodeficiency, unspecified: Secondary | ICD-10-CM

## 2020-10-28 DIAGNOSIS — R292 Abnormal reflex: Secondary | ICD-10-CM

## 2020-10-28 MED ORDER — GADOBENATE DIMEGLUMINE 529 MG/ML IV SOLN
12.0000 mL | Freq: Once | INTRAVENOUS | Status: AC | PRN
  Administered 2020-10-28: 12 mL via INTRAVENOUS

## 2020-10-30 ENCOUNTER — Other Ambulatory Visit: Payer: Self-pay | Admitting: Rheumatology

## 2020-10-30 ENCOUNTER — Ambulatory Visit
Admission: RE | Admit: 2020-10-30 | Discharge: 2020-10-30 | Disposition: A | Discharge status: Home / Self Care | Payer: Managed Care, Other (non HMO) | Source: Ambulatory Visit | Attending: Rheumatology | Admitting: Rheumatology

## 2020-10-30 ENCOUNTER — Other Ambulatory Visit: Payer: Self-pay

## 2020-10-30 DIAGNOSIS — M064 Inflammatory polyarthropathy: Secondary | ICD-10-CM

## 2020-11-25 ENCOUNTER — Other Ambulatory Visit: Payer: Self-pay

## 2020-11-25 ENCOUNTER — Other Ambulatory Visit: Payer: Self-pay | Admitting: Family Medicine

## 2020-11-25 ENCOUNTER — Ambulatory Visit
Admission: RE | Admit: 2020-11-25 | Discharge: 2020-11-25 | Disposition: A | Discharge status: Home / Self Care | Payer: Managed Care, Other (non HMO) | Source: Ambulatory Visit | Attending: Family Medicine | Admitting: Family Medicine

## 2020-11-25 DIAGNOSIS — R200 Anesthesia of skin: Secondary | ICD-10-CM

## 2020-11-25 DIAGNOSIS — M549 Dorsalgia, unspecified: Secondary | ICD-10-CM

## 2020-11-25 DIAGNOSIS — M79603 Pain in arm, unspecified: Secondary | ICD-10-CM

## 2020-11-25 DIAGNOSIS — R1031 Right lower quadrant pain: Secondary | ICD-10-CM

## 2020-11-25 DIAGNOSIS — M79606 Pain in leg, unspecified: Secondary | ICD-10-CM

## 2020-11-25 DIAGNOSIS — M542 Cervicalgia: Secondary | ICD-10-CM

## 2020-11-25 MED ORDER — IOPAMIDOL (ISOVUE-300) INJECTION 61%
100.0000 mL | Freq: Once | INTRAVENOUS | Status: AC | PRN
  Administered 2020-11-25: 100 mL via INTRAVENOUS

## 2020-11-26 ENCOUNTER — Ambulatory Visit
Admission: RE | Admit: 2020-11-26 | Discharge: 2020-11-26 | Disposition: A | Discharge status: Home / Self Care | Payer: Managed Care, Other (non HMO) | Source: Ambulatory Visit | Attending: Family Medicine | Admitting: Family Medicine

## 2020-11-26 ENCOUNTER — Other Ambulatory Visit: Payer: Self-pay | Admitting: Family Medicine

## 2020-11-26 DIAGNOSIS — M79606 Pain in leg, unspecified: Secondary | ICD-10-CM

## 2020-11-26 DIAGNOSIS — M79603 Pain in arm, unspecified: Secondary | ICD-10-CM

## 2020-11-26 DIAGNOSIS — R200 Anesthesia of skin: Secondary | ICD-10-CM

## 2020-11-26 DIAGNOSIS — M542 Cervicalgia: Secondary | ICD-10-CM

## 2020-11-26 DIAGNOSIS — N949 Unspecified condition associated with female genital organs and menstrual cycle: Secondary | ICD-10-CM

## 2020-11-26 DIAGNOSIS — M549 Dorsalgia, unspecified: Secondary | ICD-10-CM

## 2020-11-26 MED ORDER — GADOBENATE DIMEGLUMINE 529 MG/ML IV SOLN
12.0000 mL | Freq: Once | INTRAVENOUS | Status: AC | PRN
  Administered 2020-11-26: 12 mL via INTRAVENOUS

## 2020-11-27 ENCOUNTER — Other Ambulatory Visit: Payer: Managed Care, Other (non HMO)

## 2020-11-28 ENCOUNTER — Other Ambulatory Visit: Payer: Self-pay

## 2020-11-28 ENCOUNTER — Ambulatory Visit
Admission: RE | Admit: 2020-11-28 | Discharge: 2020-11-28 | Disposition: A | Discharge status: Home / Self Care | Payer: Managed Care, Other (non HMO) | Source: Ambulatory Visit | Attending: Family Medicine | Admitting: Family Medicine

## 2020-11-28 DIAGNOSIS — N949 Unspecified condition associated with female genital organs and menstrual cycle: Secondary | ICD-10-CM

## 2020-12-18 ENCOUNTER — Other Ambulatory Visit: Payer: Self-pay | Admitting: Obstetrics & Gynecology

## 2020-12-18 DIAGNOSIS — Z1231 Encounter for screening mammogram for malignant neoplasm of breast: Secondary | ICD-10-CM

## 2021-02-21 IMAGING — MR MR SACRUM / SI JOINTS WO/W CM
5 of 9 series · 25 of 48 positions shown · IV contrast (multihance)
Comparison: None.

CLINICAL DATA: Low back pain.  SI joint pain.

EXAM:
MRI SACROILIAC JOINTS WITHOUT AND WITH CONTRAST
TECHNIQUE: Multiplanar and multiecho pulse sequences of the lumbar spine were
obtained without and with intravenous contrast.
CONTRAST:  12mL MULTIHANCE GADOBENATE DIMEGLUMINE 529 MG/ML IV SOLN

[Series 4: T2 fat-sat · sagittal · 4.0mm · 0.75mm/px · 6 of 31 slices shown (1 of 2)]
[im 1/31]
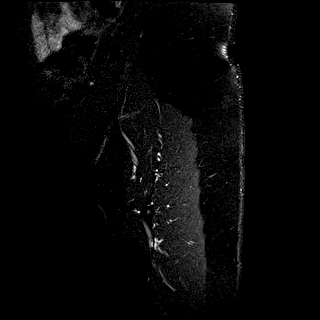
[im 7/31]
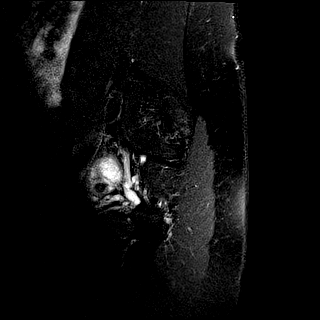
[im 13/31]
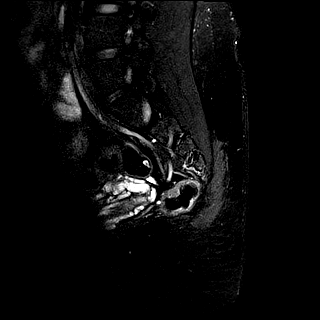
[im 19/31]
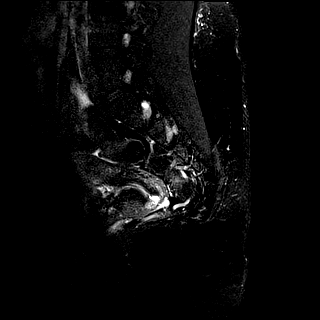
[im 25/31]
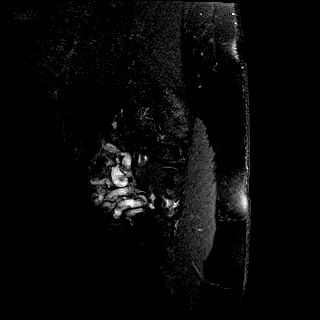
[im 31/31]
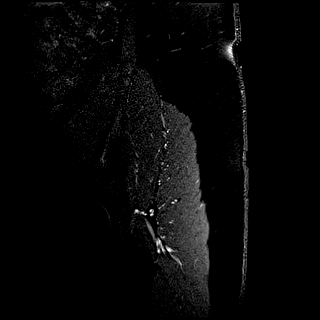

[Series 5: T1 · oblique · 3.0mm · 0.55mm/px · 5 of 24 slices shown (1 of 2)]
[im 1/24]
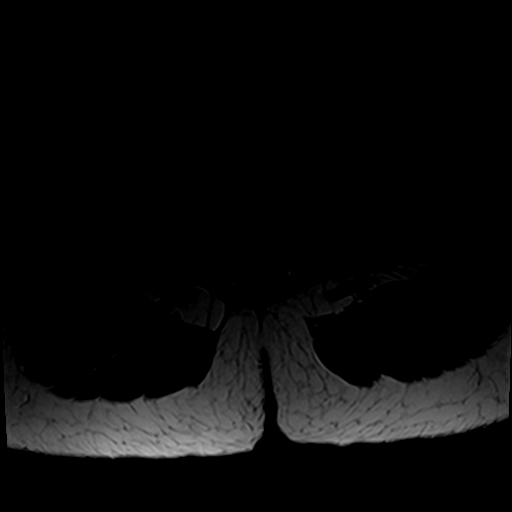
[im 6/24]
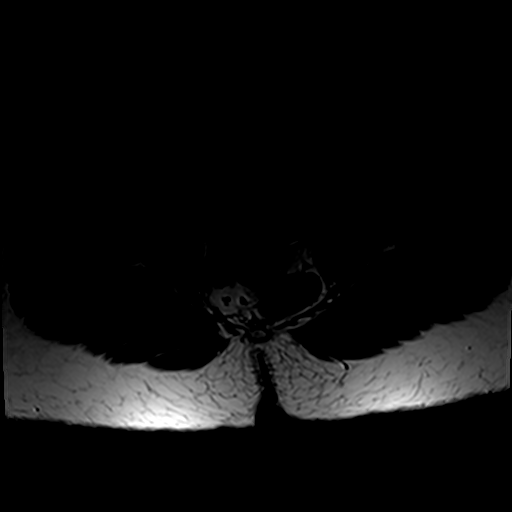
[im 12/24]
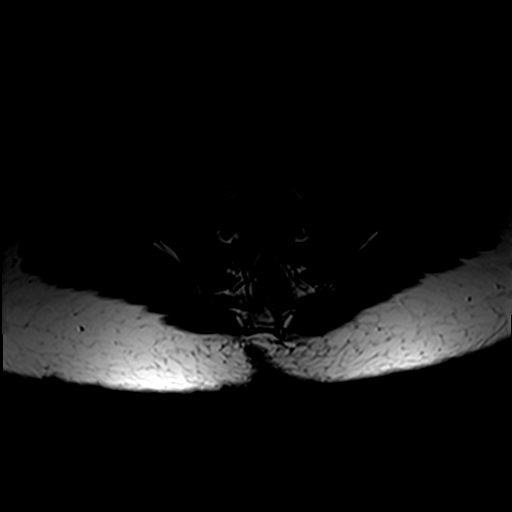
[im 18/24]
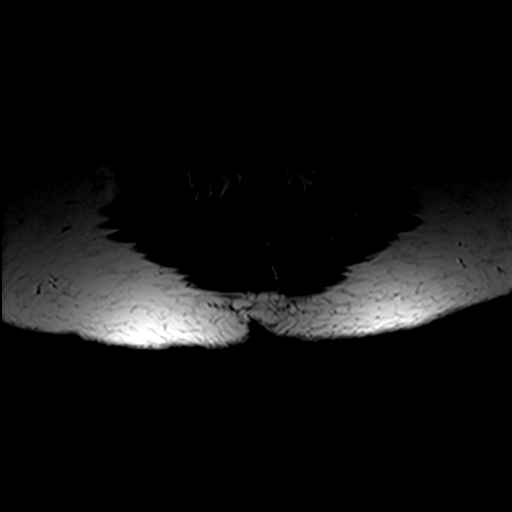
[im 24/24]
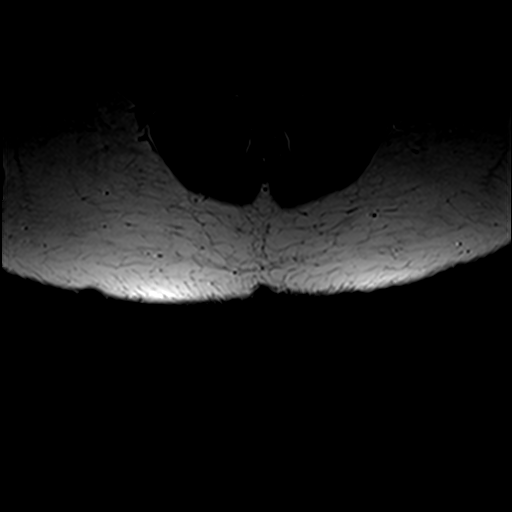

[Series 6: STIR · oblique · 3.0mm · 0.55mm/px · 4 of 24 slices shown]
[im 1/24]
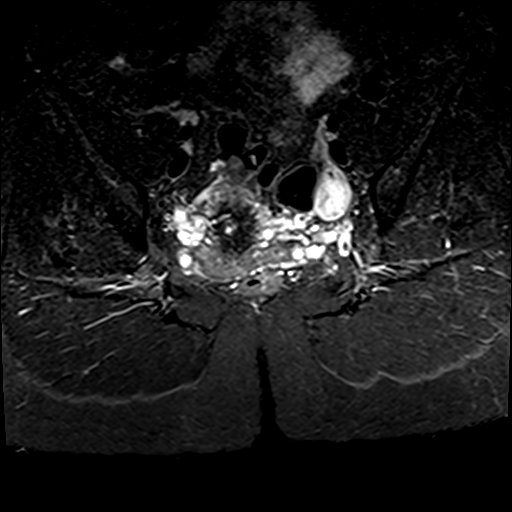
[im 8/24]
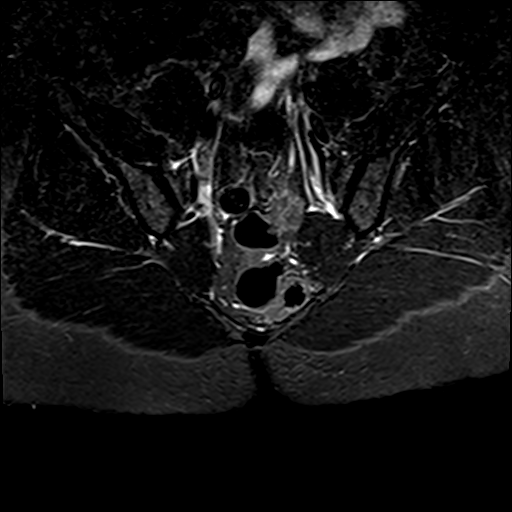
[im 16/24]
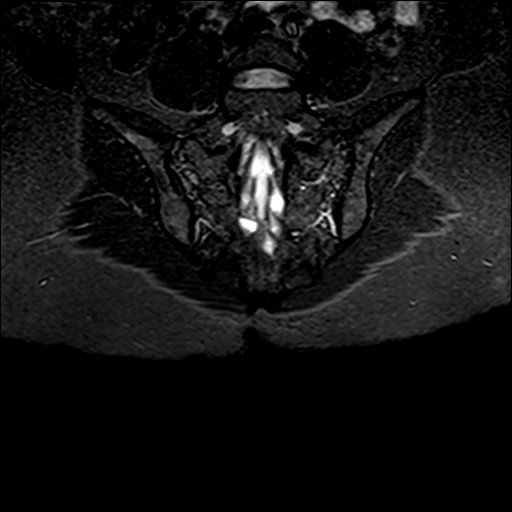
[im 24/24]
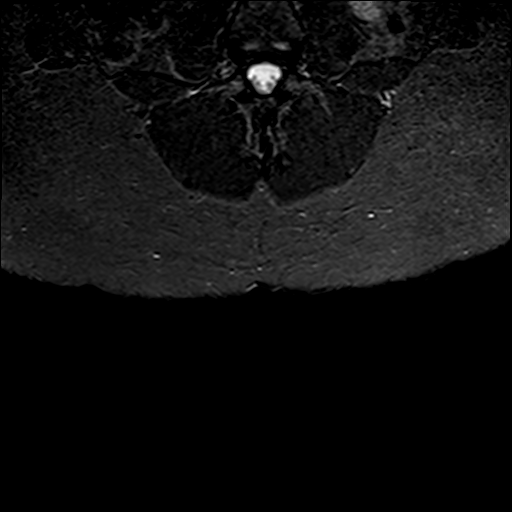

[Series 7: T2 fat-sat · axial · 4.0mm · 0.59mm/px · z∈[-97,+83]mm · 6 of 37 slices shown (2 of 2)]
[im 1/37]
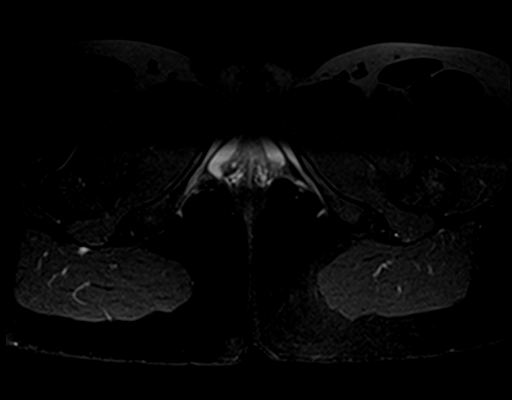
[im 8/37]
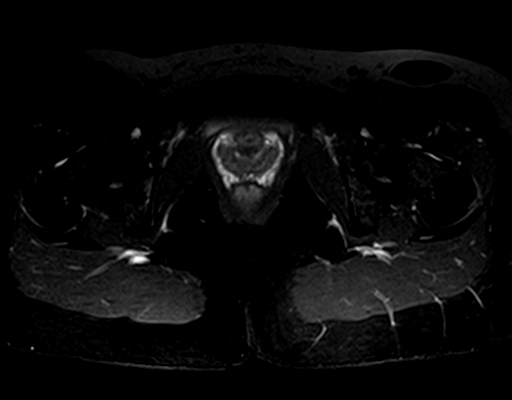
[im 15/37]
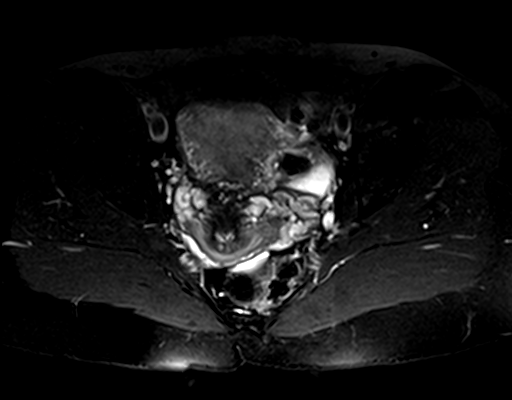
[im 22/37]
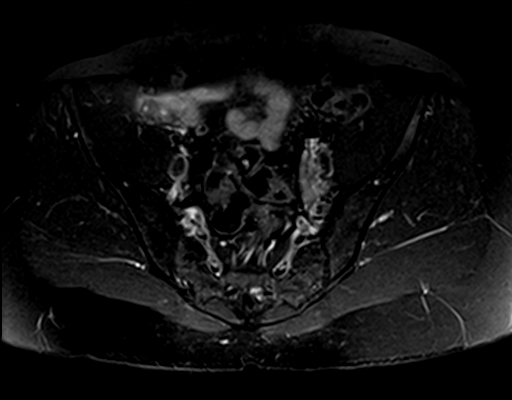
[im 29/37]
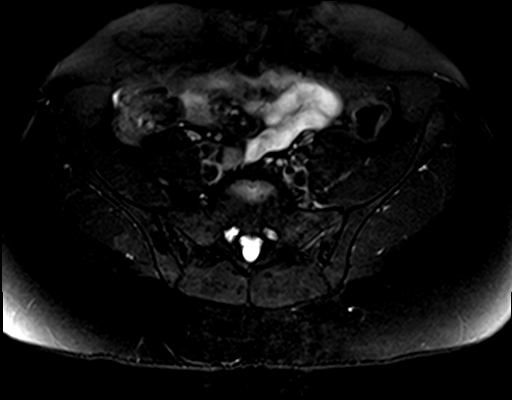
[im 37/37]
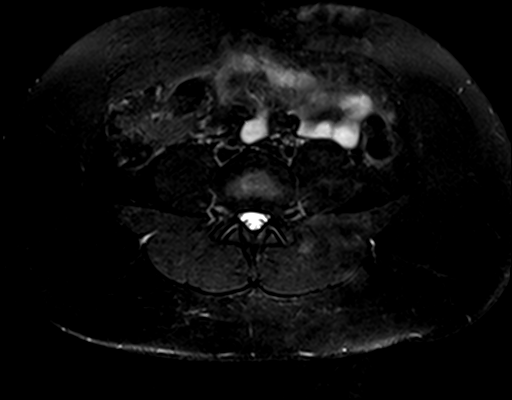

[Series 8: T1 · axial · 4.0mm · 0.59mm/px · z∈[-97,+8]mm · 4 of 37 slices shown (2 of 2)]
[im 1/37]
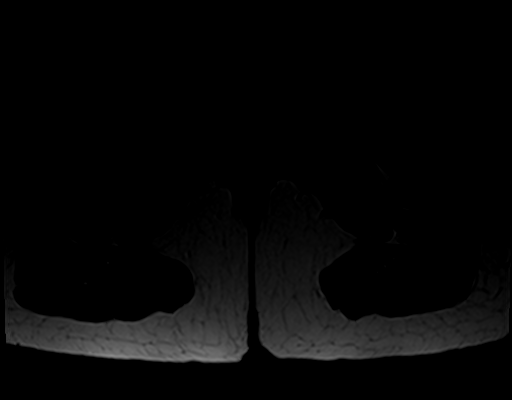
[im 8/37]
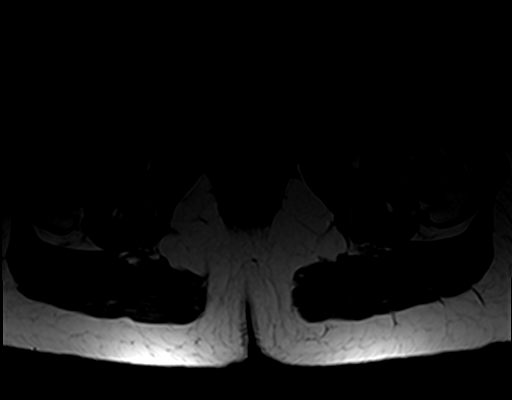
[im 15/37]
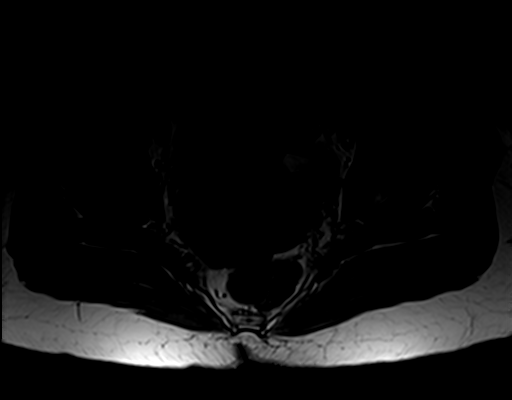
[im 22/37]
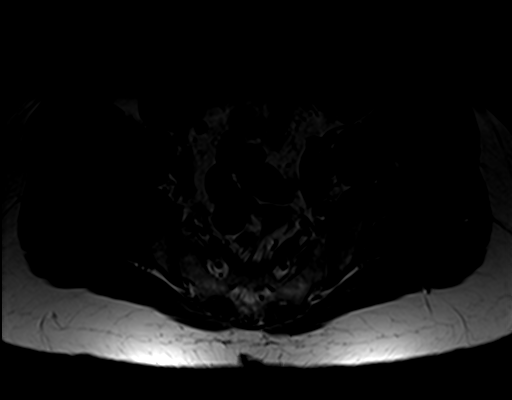

[25 of 48 positions shown; findings below may reference images not displayed]

FINDINGS: Bones/Joint/Cartilage

No marrow signal abnormality. No fracture or dislocation. Normal
alignment. No joint effusion.

Normal bilateral sacroiliac joints. No SI joint widening or erosive
changes. No SI joint effusion or synovitis.

Ligaments, Muscles and Tendons
Muscles are normal. No muscle atrophy. No intramuscular fluid
collection or hematoma. No muscle edema.

Soft tissue
No fluid collection or hematoma. No soft tissue mass. T-type IUD
within the uterus. No inguinal hernia.
IMPRESSION: 1. Normal bilateral sacroiliac joints.

## 2021-02-23 ENCOUNTER — Ambulatory Visit: Payer: Managed Care, Other (non HMO)

## 2021-02-26 ENCOUNTER — Ambulatory Visit
Admission: RE | Admit: 2021-02-26 | Discharge: 2021-02-26 | Disposition: A | Discharge status: Home / Self Care | Payer: Managed Care, Other (non HMO) | Source: Ambulatory Visit | Attending: Obstetrics & Gynecology | Admitting: Obstetrics & Gynecology

## 2021-02-26 ENCOUNTER — Other Ambulatory Visit: Payer: Self-pay

## 2021-02-26 DIAGNOSIS — Z1231 Encounter for screening mammogram for malignant neoplasm of breast: Secondary | ICD-10-CM
